# Patient Record
Sex: Female | Born: 1960 | Race: White | Hispanic: No | Marital: Married | Smoking: Never smoker
Health system: Southern US, Community
[De-identification: ages and names within clinical notes are randomized; demographics above are authoritative.]

## PROBLEM LIST (undated history)

## (undated) DIAGNOSIS — Z789 Other specified health status: Secondary | ICD-10-CM

## (undated) DIAGNOSIS — N841 Polyp of cervix uteri: Secondary | ICD-10-CM

## (undated) HISTORY — PX: NO PAST SURGERIES: SHX2092

## (undated) HISTORY — DX: Polyp of cervix uteri: N84.1

---

## 2011-08-07 ENCOUNTER — Ambulatory Visit: Payer: BC Managed Care – PPO | Attending: Internal Medicine | Admitting: Sleep Medicine

## 2011-08-07 DIAGNOSIS — Z6829 Body mass index (BMI) 29.0-29.9, adult: Secondary | ICD-10-CM | POA: Insufficient documentation

## 2011-08-07 DIAGNOSIS — G4733 Obstructive sleep apnea (adult) (pediatric): Secondary | ICD-10-CM | POA: Insufficient documentation

## 2011-08-07 DIAGNOSIS — G4710 Hypersomnia, unspecified: Secondary | ICD-10-CM | POA: Insufficient documentation

## 2011-08-07 DIAGNOSIS — G4730 Sleep apnea, unspecified: Secondary | ICD-10-CM

## 2011-08-09 NOTE — Procedures (Signed)
[name], [name]                 ACCOUNT NO.:  [id number]  MEDICAL RECORD NO.:  [id number]          PATIENT TYPE:  OUT  LOCATION:  SLEEP LAB                     FACILITY:  [facility]  PHYSICIAN:  Maurina Fawaz A. Gerilyn Pilgrim, M.D. DATE OF BIRTH:  1961/08/25  DATE OF STUDY:                           NOCTURNAL POLYSOMNOGRAM  REFERRING PHYSICIAN:  ZACK HALL  INDICATIONS:  A 50 year old who presents with fatigue, snoring and hypersomnia.  The study is being done to evaluate for obstructive sleep apnea syndrome.  MEDICATIONS:  Vitamin C.  EPWORTH SLEEPINESS SCALE:  9.  BMI:  29.  ARCHITECTURAL SUMMARY:  The total recording time is 430 minutes.  Sleep efficiency 86%, sleep latency 28.5 MIN.  REM latency 98.5 MIN.  Stage N1 6%, N2 51%, N3 23% and REM sleep 21%.  RESPIRATORY SUMMARY:  Baseline oxygen saturation is 98, lowest saturation is 88 during REM sleep.  Diagnostic AHI is 5 and RDI 13.  LIMB MOVEMENT SUMMARY:  PLM index is 1.  ELECTROCARDIOGRAM SUMMARY:  Average heart rate is 71 with no significant dysrhythmias observed.  IMPRESSION: 1. Mild obstructive sleep apnea syndrome not requiring positive     pressure 2. 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  Thank you for this referral.     Desiree Fleming A. Gerilyn Pilgrim, M.D.    KAD/MEDQ  D:  08/08/2011 16:10:96  T:  08/09/2011 02:57:52  Job:  [id number]

## 2016-10-30 ENCOUNTER — Other Ambulatory Visit (HOSPITAL_COMMUNITY): Payer: Self-pay | Admitting: Internal Medicine

## 2016-10-30 DIAGNOSIS — Z1231 Encounter for screening mammogram for malignant neoplasm of breast: Secondary | ICD-10-CM

## 2016-11-09 ENCOUNTER — Encounter (HOSPITAL_COMMUNITY): Payer: Self-pay | Admitting: Radiology

## 2016-11-09 ENCOUNTER — Ambulatory Visit (HOSPITAL_COMMUNITY)
Admission: RE | Admit: 2016-11-09 | Discharge: 2016-11-09 | Disposition: A | Discharge status: Home / Self Care | Payer: BC Managed Care – PPO | Source: Ambulatory Visit | Attending: Internal Medicine | Admitting: Internal Medicine

## 2016-11-09 DIAGNOSIS — Z1231 Encounter for screening mammogram for malignant neoplasm of breast: Secondary | ICD-10-CM | POA: Diagnosis not present

## 2019-06-07 ENCOUNTER — Other Ambulatory Visit (HOSPITAL_COMMUNITY): Payer: Self-pay | Admitting: Adult Health Nurse Practitioner

## 2019-06-07 DIAGNOSIS — Z1231 Encounter for screening mammogram for malignant neoplasm of breast: Secondary | ICD-10-CM

## 2019-06-07 DIAGNOSIS — Z780 Asymptomatic menopausal state: Secondary | ICD-10-CM

## 2019-06-07 DIAGNOSIS — E2839 Other primary ovarian failure: Secondary | ICD-10-CM

## 2019-06-14 ENCOUNTER — Encounter: Payer: Self-pay | Admitting: *Deleted

## 2019-07-17 ENCOUNTER — Ambulatory Visit: Payer: BC Managed Care – PPO

## 2019-07-26 ENCOUNTER — Other Ambulatory Visit: Payer: Self-pay

## 2019-07-26 ENCOUNTER — Ambulatory Visit (INDEPENDENT_AMBULATORY_CARE_PROVIDER_SITE_OTHER): Payer: Self-pay | Admitting: *Deleted

## 2019-07-26 DIAGNOSIS — Z1211 Encounter for screening for malignant neoplasm of colon: Secondary | ICD-10-CM

## 2019-07-26 MED ORDER — PEG 3350-KCL-NA BICARB-NACL 420 G PO SOLR: 4000.0000 mL | Freq: Once | ORAL | 0 refills | Status: AC

## 2019-07-26 NOTE — Progress Notes (Signed)
Gastroenterology Pre-Procedure Review  Request Date: 07/26/2019 Requesting Physician: Dr. Wende Neighbors, no previous TCS  PATIENT REVIEW QUESTIONS: The patient responded to the following health history questions as indicated:    1. Diabetes Melitis: no 2. Joint replacements in the past 12 months: no 3. Major health problems in the past 3 months: no 4. Has an artificial valve or MVP: no 5. Has a defibrillator: no 6. Has been advised in past to take antibiotics in advance of a procedure like teeth cleaning: no 7. Family history of colon cancer: yes, grandmother age 36  8. Alcohol Use: no 9. Illicit drug Use: no 10. History of sleep apnea: yes, mild but no CPAP required  11. History of coronary artery or other vascular stents placed within the last 12 months: no 12. History of any prior anesthesia complications: no 13. There is no height or weight on file to calculate BMI.ht: 5'7 wt: 185 lbs    MEDICATIONS & ALLERGIES:    Patient reports the following regarding taking any blood thinners:   Plavix? no Aspirin? no Coumadin? no Brilinta? no Xarelto? no Eliquis? no Pradaxa? no Savaysa? no Effient? no  Patient confirms/reports the following medications:  Current Outpatient Medications  Medication Sig Dispense Refill  . Ascorbic Acid (VITAMIN C PO) Take by mouth once a week.    . Cholecalciferol (VITAMIN D-3) 25 MCG (1000 UT) CAPS Take by mouth once a week.     No current facility-administered medications for this visit.     Patient confirms/reports the following allergies:  Allergies  Allergen Reactions  . Sulfamethoxazole Hives    No orders of the defined types were placed in this encounter.   AUTHORIZATION INFORMATION Primary Insurance: [place] [place],  [place] #: [id number],  Group #: [id number] Pre-Cert / Josem Kaufmann required: No, not required  SCHEDULE INFORMATION: Procedure has been scheduled as follows:  Date: 10/10/2019, Time: 9:30 Location: [facility] with Dr. Gala Romney  This  Gastroenterology Pre-Precedure Review Form is being routed to the following provider(s): Neil Crouch, PA-C

## 2019-07-26 NOTE — Patient Instructions (Signed)
Jodi [name]   February 09, 1961 MRN: [id number]    Procedure Date: 10/10/2019 Time to register: 8:30 Place to register: [facility] Procedure Time: 9:30 Scheduled provider: Dr. Gala Romney  PREPARATION FOR COLONOSCOPY WITH TRI-LYTE SPLIT PREP  Please notify us immediately if you are diabetic, take iron supplements, or if you are on Coumadin or any other blood thinners.    You will need to purchase 1 fleet enema and 1 box of Bisacodyl 41m tablets.   2 DAYS BEFORE PROCEDURE:  DATE: 10/08/2019  DAY: Sunday Begin clear liquid diet AFTER your lunch meal. NO SOLID FOODS after this point.  1 DAY BEFORE PROCEDURE:  DATE: 10/09/2019  DAY: Monday Continue clear liquids the entire day - NO SOLID FOOD.    At 2:00 pm:  Take 2 Bisacodyl tablets.   At 4:00pm:  Start drinking your solution. Make sure you mix well per instructions on the bottle. Try to drink 1 (one) 8 ounce glass every 10-15 minutes until you have consumed HALF the jug. You should complete by 6:00pm.You must keep the left over solution refrigerated until completed next day.  Continue clear liquids. You must drink plenty of clear liquids to prevent dehyration and kidney failure.     DAY OF PROCEDURE:   DATE: 10/10/2019  DAY: Tuesday If you take medications for your heart, blood pressure or breathing, you may take these medications.    Five hours before your procedure time @ 4:30 am:  Finish remaining amout of bowel prep, drinking 1 (one) 8 ounce glass every 10-15 minutes until complete. You have two hours to consume remaining prep.   Three hours before your procedure time @ 6:30 am:  Nothing by mouth.   At least one hour before going to the hospital:  Give yourself one Fleet enema. You may take your morning medications with sip of water unless we have instructed otherwise.      Please see below for Dietary Information.  CLEAR LIQUIDS INCLUDE:  Water Jello (NOT red in color)   Ice Popsicles (NOT red in color)   Tea (sugar  ok, no milk/cream) Powdered fruit flavored drinks  Coffee (sugar ok, no milk/cream) Gatorade/ Lemonade/ Kool-Aid  (NOT red in color)   Juice: apple, white grape, white cranberry Soft drinks  Clear bullion, consomme, broth (fat free beef/chicken/vegetable)  Carbonated beverages (any kind)  Strained chicken noodle soup Hard Candy   Remember: Clear liquids are liquids that will allow you to see your fingers on the other side of a clear glass. Be sure liquids are NOT red in color, and not cloudy, but CLEAR.  DO NOT EAT OR DRINK ANY OF THE FOLLOWING:  Dairy products of any kind   Cranberry juice Tomato juice / V8 juice   Grapefruit juice Orange juice     Red grape juice  Do not eat any solid foods, including such foods as: cereal, oatmeal, yogurt, fruits, vegetables, creamed soups, eggs, bread, crackers, pureed foods in a blender, etc.   HELPFUL HINTS FOR DRINKING PREP SOLUTION:   Make sure prep is extremely cold. Mix and refrigerate the the morning of the prep. You may also put in the freezer.   You may try mixing some Crystal Light or Country Time Lemonade if you prefer. Mix in small amounts; add more if necessary.  Try drinking through a straw  Rinse mouth with water or a mouthwash between glasses, to remove after-taste.  Try sipping on a cold beverage /ice/ popsicles between glasses of prep.  Place  a piece of sugar-free hard candy in mouth between glasses.  If you become nauseated, try consuming smaller amounts, or stretch out the time between glasses. Stop for 30-60 minutes, then slowly start back drinking.        OTHER INSTRUCTIONS  You will need a responsible adult at least 59 years of age to accompany you and drive you home. This person must remain in the waiting room during your procedure. The hospital will cancel your procedure if you do not have a responsible adult with you.   1. Wear loose fitting clothing that is easily removed. 2. Leave jewelry and other valuables  at home.  3. Remove all body piercing jewelry and leave at home. 4. Total time from sign-in until discharge is approximately 2-3 hours. 5. You should go home directly after your procedure and rest. You can resume normal activities the day after your procedure. 6. The day of your procedure you should not:  Drive  Make legal decisions  Operate machinery  Drink alcohol  Return to work   You may call the office (Dept: [phone]) before 5:00pm, or page the doctor on call [phone]) after 5:00pm, for further instructions, if necessary.   Insurance Information YOU WILL NEED TO CHECK WITH YOUR INSURANCE COMPANY FOR THE BENEFITS OF COVERAGE YOU HAVE FOR THIS PROCEDURE.  UNFORTUNATELY, NOT ALL INSURANCE COMPANIES HAVE BENEFITS TO COVER ALL OR PART OF THESE TYPES OF PROCEDURES.  IT IS YOUR RESPONSIBILITY TO CHECK YOUR BENEFITS, HOWEVER, WE WILL BE GLAD TO ASSIST YOU WITH ANY CODES YOUR INSURANCE COMPANY MAY NEED.    PLEASE NOTE THAT MOST INSURANCE COMPANIES WILL NOT COVER A SCREENING COLONOSCOPY FOR PEOPLE UNDER THE AGE OF 50  IF YOU HAVE BCBS INSURANCE, YOU MAY HAVE BENEFITS FOR A SCREENING COLONOSCOPY BUT IF POLYPS ARE FOUND THE DIAGNOSIS WILL CHANGE AND THEN YOU MAY HAVE A DEDUCTIBLE THAT WILL NEED TO BE MET. SO PLEASE MAKE SURE YOU CHECK YOUR BENEFITS FOR A SCREENING COLONOSCOPY AS WELL AS A DIAGNOSTIC COLONOSCOPY.

## 2019-07-26 NOTE — Addendum Note (Signed)
Addended by: Metro Kung on: 07/26/2019 12:40 PM   Modules accepted: Orders, SmartSet

## 2019-07-26 NOTE — Progress Notes (Signed)
Ok to schedule.

## 2019-09-11 ENCOUNTER — Telehealth: Payer: Self-pay | Admitting: *Deleted

## 2019-09-11 NOTE — Telephone Encounter (Addendum)
Pt called in and requested to cancel her procedure.  She did not want to reschedule right now.  She said that she may call us back sometime in the summer.  Pt aware that she may need to get her PCP to send a new referral if it is over 6 months.  Pt voiced understanding.  Endo notified of cancellation.

## 2019-10-06 ENCOUNTER — Other Ambulatory Visit (HOSPITAL_COMMUNITY): Payer: BC Managed Care – PPO

## 2019-10-10 ENCOUNTER — Ambulatory Visit (HOSPITAL_COMMUNITY): Admit: 2019-10-10 | Payer: BC Managed Care – PPO | Admitting: Internal Medicine

## 2019-10-10 ENCOUNTER — Encounter (HOSPITAL_COMMUNITY): Payer: Self-pay

## 2019-10-10 SURGERY — COLONOSCOPY
Anesthesia: Moderate Sedation

## 2020-06-12 ENCOUNTER — Other Ambulatory Visit (HOSPITAL_COMMUNITY): Payer: Self-pay | Admitting: Internal Medicine

## 2020-06-12 DIAGNOSIS — Z1231 Encounter for screening mammogram for malignant neoplasm of breast: Secondary | ICD-10-CM

## 2020-06-20 ENCOUNTER — Encounter: Payer: Self-pay | Admitting: *Deleted

## 2020-08-14 ENCOUNTER — Ambulatory Visit: Payer: Self-pay

## 2020-08-14 ENCOUNTER — Encounter: Payer: Self-pay | Admitting: Internal Medicine

## 2020-08-14 ENCOUNTER — Other Ambulatory Visit: Payer: Self-pay

## 2021-08-18 ENCOUNTER — Other Ambulatory Visit (HOSPITAL_COMMUNITY): Payer: Self-pay | Admitting: Family Medicine

## 2021-08-18 DIAGNOSIS — M79645 Pain in left finger(s): Secondary | ICD-10-CM

## 2021-08-18 DIAGNOSIS — Z13820 Encounter for screening for osteoporosis: Secondary | ICD-10-CM

## 2021-08-18 DIAGNOSIS — Z1231 Encounter for screening mammogram for malignant neoplasm of breast: Secondary | ICD-10-CM

## 2021-08-21 ENCOUNTER — Ambulatory Visit (HOSPITAL_COMMUNITY)
Admission: RE | Admit: 2021-08-21 | Discharge: 2021-08-21 | Disposition: A | Discharge status: Home / Self Care | Payer: BC Managed Care – PPO | Source: Ambulatory Visit | Attending: Family Medicine | Admitting: Family Medicine

## 2021-08-21 ENCOUNTER — Encounter: Payer: Self-pay | Admitting: *Deleted

## 2021-08-21 ENCOUNTER — Other Ambulatory Visit: Payer: Self-pay

## 2021-08-21 DIAGNOSIS — M79645 Pain in left finger(s): Secondary | ICD-10-CM | POA: Diagnosis not present

## 2021-09-16 ENCOUNTER — Ambulatory Visit: Payer: BC Managed Care – PPO

## 2021-09-23 ENCOUNTER — Ambulatory Visit: Payer: Self-pay

## 2021-09-25 ENCOUNTER — Ambulatory Visit (INDEPENDENT_AMBULATORY_CARE_PROVIDER_SITE_OTHER): Payer: Self-pay | Admitting: *Deleted

## 2021-09-25 ENCOUNTER — Other Ambulatory Visit: Payer: Self-pay

## 2021-09-25 VITALS — Ht 67.0 in | Wt 198.0 lb

## 2021-09-25 DIAGNOSIS — Z1211 Encounter for screening for malignant neoplasm of colon: Secondary | ICD-10-CM

## 2021-09-25 NOTE — Progress Notes (Addendum)
Gastroenterology Pre-Procedure Review  Request Date: 09/25/2021 Requesting Physician: Susy Manor, FNP-C, no previous TCS, family hx of colon cancer (father, grandmother)  PATIENT REVIEW QUESTIONS: The patient responded to the following health history questions as indicated:    1. Diabetes Melitis: no 2. Joint replacements in the past 12 months: no 3. Major health problems in the past 3 months: no 4. Has an artificial valve or MVP: no 5. Has a defibrillator: no 6. Has been advised in past to take antibiotics in advance of a procedure like teeth cleaning: no 7. Family history of colon cancer: yes, father: age 51, grandmother: age 28's  8. Alcohol Use: no 9. Illicit drug Use: no 10. History of sleep apnea: yes, mild sleep apnea, not on CPAP  11. History of coronary artery or other vascular stents placed within the last 12 months: no 12. History of any prior anesthesia complications: no 13. Body mass index is 31.01 kg/m.    MEDICATIONS & ALLERGIES:    Patient reports the following regarding taking any blood thinners:   Plavix? no Aspirin? no Coumadin? no Brilinta? no Xarelto? no Eliquis? no Pradaxa? no Savaysa? no Effient? no  Patient confirms/reports the following medications:  Current Outpatient Medications  Medication Sig Dispense Refill   Ascorbic Acid (VITAMIN C PO) Take by mouth once a week.     Cholecalciferol (VITAMIN D-3) 25 MCG (1000 UT) CAPS Take by mouth once a week.     No current facility-administered medications for this visit.    Patient confirms/reports the following allergies:  Allergies  Allergen Reactions   Sulfamethoxazole Hives    No orders of the defined types were placed in this encounter.   AUTHORIZATION INFORMATION Primary Insurance: [facility],  [place] #: [id number],  Group #: [id number] Pre-Cert / Berkley Harvey required: No, not required  SCHEDULE INFORMATION: Procedure has been scheduled as follows:  Date: 11/07/2021, Time:   8:15 Location: [facility] with Dr. Marletta Lor  This Gastroenterology Pre-Precedure Review Form is being routed to the following provider(s): Lewie Loron, NP

## 2021-09-29 NOTE — Progress Notes (Signed)
Appropriate. ASA 2.  

## 2021-10-06 ENCOUNTER — Encounter: Payer: Self-pay | Admitting: *Deleted

## 2021-10-06 MED ORDER — CLENPIQ 10-3.5-12 MG-GM -GM/160ML PO SOLN: 1.0000 | Freq: Once | ORAL | 0 refills | Status: AC

## 2021-10-06 NOTE — Progress Notes (Signed)
Spoke to pt.  Scheduled procedure for 11/07/2021 with arrival at 6:45.  Pt made aware that I am mailing out prep instructions.  Pt confirmed mailing address.

## 2021-10-06 NOTE — Addendum Note (Signed)
Addended by: Noreene Larsson on: 10/06/2021 09:13 AM   Modules accepted: Orders

## 2021-11-07 ENCOUNTER — Ambulatory Visit (HOSPITAL_COMMUNITY): Payer: BC Managed Care – PPO | Admitting: Anesthesiology

## 2021-11-07 ENCOUNTER — Encounter (HOSPITAL_COMMUNITY): Payer: Self-pay

## 2021-11-07 ENCOUNTER — Encounter (HOSPITAL_COMMUNITY)
Admission: RE | Disposition: A | Discharge status: Home / Self Care | Payer: Self-pay | Source: Home / Self Care | Attending: Internal Medicine

## 2021-11-07 ENCOUNTER — Other Ambulatory Visit: Payer: Self-pay

## 2021-11-07 ENCOUNTER — Ambulatory Visit (HOSPITAL_COMMUNITY)
Admission: RE | Admit: 2021-11-07 | Discharge: 2021-11-07 | Disposition: A | Discharge status: Home / Self Care | Payer: BC Managed Care – PPO | Attending: Internal Medicine | Admitting: Internal Medicine

## 2021-11-07 DIAGNOSIS — Z800 Family history of malignant neoplasm of digestive organs: Secondary | ICD-10-CM | POA: Diagnosis not present

## 2021-11-07 DIAGNOSIS — K5730 Diverticulosis of large intestine without perforation or abscess without bleeding: Secondary | ICD-10-CM | POA: Diagnosis not present

## 2021-11-07 DIAGNOSIS — Z1211 Encounter for screening for malignant neoplasm of colon: Secondary | ICD-10-CM

## 2021-11-07 HISTORY — PX: COLONOSCOPY WITH PROPOFOL: SHX5780

## 2021-11-07 HISTORY — DX: Other specified health status: Z78.9

## 2021-11-07 SURGERY — COLONOSCOPY WITH PROPOFOL
Anesthesia: General

## 2021-11-07 MED ORDER — LIDOCAINE HCL (CARDIAC) PF 100 MG/5ML IV SOSY
PREFILLED_SYRINGE | INTRAVENOUS | Status: DC | PRN
  Administered 2021-11-07: 50 mg via INTRAVENOUS

## 2021-11-07 MED ORDER — PROPOFOL 10 MG/ML IV BOLUS
INTRAVENOUS | Status: DC | PRN
  Administered 2021-11-07: 100 mg via INTRAVENOUS
  Administered 2021-11-07 (×2): 50 mg via INTRAVENOUS

## 2021-11-07 MED ORDER — LACTATED RINGERS IV SOLN: INTRAVENOUS | Status: DC

## 2021-11-07 NOTE — Discharge Instructions (Addendum)
°  Colonoscopy Discharge Instructions  Read the instructions outlined below and refer to this sheet in the next few weeks. These discharge instructions provide you with general information on caring for yourself after you leave the hospital. Your doctor may also give you specific instructions. While your treatment has been planned according to the most current medical practices available, unavoidable complications occasionally occur.   ACTIVITY You may resume your regular activity, but move at a slower pace for the next 24 hours.  Take frequent rest periods for the next 24 hours.  Walking will help get rid of the air and reduce the bloated feeling in your belly (abdomen).  No driving for 24 hours (because of the medicine (anesthesia) used during the test).   Do not sign any important legal documents or operate any machinery for 24 hours (because of the anesthesia used during the test).  NUTRITION Drink plenty of fluids.  You may resume your normal diet as instructed by your doctor.  Begin with a light meal and progress to your normal diet. Heavy or fried foods are harder to digest and may make you feel sick to your stomach (nauseated).  Avoid alcoholic beverages for 24 hours or as instructed.  MEDICATIONS You may resume your normal medications unless your doctor tells you otherwise.  WHAT YOU CAN EXPECT TODAY Some feelings of bloating in the abdomen.  Passage of more gas than usual.  Spotting of blood in your stool or on the toilet paper.  IF YOU HAD POLYPS REMOVED DURING THE COLONOSCOPY: No aspirin products for 7 days or as instructed.  No alcohol for 7 days or as instructed.  Eat a soft diet for the next 24 hours.  FINDING OUT THE RESULTS OF YOUR TEST Not all test results are available during your visit. If your test results are not back during the visit, make an appointment with your caregiver to find out the results. Do not assume everything is normal if you have not heard from your  caregiver or the medical facility. It is important for you to follow up on all of your test results.  SEEK IMMEDIATE MEDICAL ATTENTION IF: You have more than a spotting of blood in your stool.  Your belly is swollen (abdominal distention).  You are nauseated or vomiting.  You have a temperature over 101.  You have abdominal pain or discomfort that is severe or gets worse throughout the day.   Your colonoscopy was relatively unremarkable.  I did not find any polyps or evidence of colon cancer.  I recommend repeating colonoscopy in 10 years for colon cancer screening purposes.  You do have diverticulosis. I would recommend increasing fiber in your diet or adding OTC Benefiber/Metamucil. Be sure to drink at least 4 to 6 glasses of water daily. Follow-up with GI as needed.   I hope you have a great rest of your week!  Charles K. Carver, D.O. Gastroenterology and Hepatology [facility]  

## 2021-11-07 NOTE — Transfer of Care (Signed)
Immediate Anesthesia Transfer of Care Note  Patient: Jodi [name]  Procedure(s) Performed: COLONOSCOPY WITH PROPOFOL  Patient Location: Endoscopy Unit  Anesthesia Type:General  Level of Consciousness: awake and alert   Airway & Oxygen Therapy: Patient Spontanous Breathing  Post-op Assessment: Report given to RN and Post -op Vital signs reviewed and stable  Post vital signs: Reviewed and stable  Last Vitals:  Vitals Value Taken Time  BP    Temp    Pulse    Resp    SpO2      Last Pain:  Vitals:   11/07/21 0801  TempSrc:   PainSc: 0-No pain      Patients Stated Pain Goal: 7 (11/07/21 0700)  Complications: No notable events documented.

## 2021-11-07 NOTE — Anesthesia Preprocedure Evaluation (Signed)
Anesthesia Evaluation  Patient identified by MRN, date of birth, ID band Patient awake    Reviewed: Allergy & Precautions, NPO status , Patient's Chart, lab work & pertinent test results  Airway Mallampati: I  TM Distance: >3 FB Neck ROM: Full    Dental no notable dental hx.    Pulmonary neg pulmonary ROS,    Pulmonary exam normal        Cardiovascular negative cardio ROS Normal cardiovascular exam     Neuro/Psych negative neurological ROS     GI/Hepatic negative GI ROS, Neg liver ROS,   Endo/Other  negative endocrine ROS  Renal/GU negative Renal ROS     Musculoskeletal negative musculoskeletal ROS (+)   Abdominal   Peds  Hematology negative hematology ROS (+)   Anesthesia Other Findings   Reproductive/Obstetrics                             Anesthesia Physical Anesthesia Plan  ASA: 2  Anesthesia Plan: General   Post-op Pain Management:    Induction: Intravenous  PONV Risk Score and Plan: 2 and TIVA  Airway Management Planned: Natural Airway and Nasal Cannula  Additional Equipment:   Intra-op Plan:   Post-operative Plan:   Informed Consent: I have reviewed the patients History and Physical, chart, labs and discussed the procedure including the risks, benefits and alternatives for the proposed anesthesia with the patient or authorized representative who has indicated his/her understanding and acceptance.     Dental advisory given  Plan Discussed with: CRNA  Anesthesia Plan Comments:         Anesthesia Quick Evaluation

## 2021-11-07 NOTE — Op Note (Signed)
[facility] Patient Name: Jodi [name] Procedure Date: 11/07/2021 8:07 AM MRN: [id number] Date of Birth: 18-Nov-1960 Attending MD: Elon Alas. Abbey Chatters DO CSN: [id number] Age: 61 Admit Type: Outpatient Procedure:                Colonoscopy Indications:              Screening in patient at increased risk: Family                            history of father and paternal grandmother with                            colorectal cancer Providers:                Elon Alas. Abbey Chatters, DO, Hughie Closs, RN, Janeece Riggers, RN, Randa Spike, Technician Referring MD:              Medicines:                See the Anesthesia note for documentation of the                            administered medications Complications:            No immediate complications. Estimated Blood Loss:     Estimated blood loss: none. Procedure:                Pre-Anesthesia Assessment:                           - The anesthesia plan was to use monitored                            anesthesia care (MAC).                           After obtaining informed consent, the colonoscope                            was passed under direct vision. Throughout the                            procedure, the patient's blood pressure, pulse, and                            oxygen saturations were monitored continuously. The                            [id number]-HQ190L ([id number]) scope was introduced through                            the anus and advanced to the the cecum, identified                            by appendiceal orifice and ileocecal valve.  The                            colonoscopy was performed without difficulty. The                            patient tolerated the procedure well. The quality                            of the bowel preparation was evaluated using the                            BBPS [facility] Bowel Preparation Scale) with scores                            of: Right Colon = 3, Transverse Colon  = 3 and Left                            Colon = 3 (entire mucosa seen well with no residual                            staining, small fragments of stool or opaque                            liquid). The total BBPS score equals 9. Scope In: 8:11:59 AM Scope Out: 8:22:25 AM Scope Withdrawal Time: 0 hours 7 minutes 28 seconds  Total Procedure Duration: 0 hours 10 minutes 26 seconds  Findings:      The perianal and digital rectal examinations were normal.      A few small-mouthed diverticula were found in the sigmoid colon.      The exam was otherwise without abnormality. Impression:               - Diverticulosis in the sigmoid colon.                           - The examination was otherwise normal.                           - No specimens collected. Moderate Sedation:      Per Anesthesia Care Recommendation:           - Patient has a contact number available for                            emergencies. The signs and symptoms of potential                            delayed complications were discussed with the                            patient. Return to normal activities tomorrow.                            Written discharge instructions were provided to the  patient.                           - Resume previous diet.                           - Continue present medications.                           - Repeat colonoscopy in 5-10 years for screening                            purposes.                           - Return to GI clinic PRN. Procedure Code(s):        --- Professional ---                           M6004, Colorectal cancer screening; colonoscopy on                            individual at high risk Diagnosis Code(s):        --- Professional ---                           Z80.0, Family history of malignant neoplasm of                            digestive organs                           K57.30, Diverticulosis of large intestine without                             perforation or abscess without bleeding CPT copyright 2019 American Medical Association. All rights reserved. The codes documented in this report are preliminary and upon coder review may  be revised to meet current compliance requirements. Elon Alas. Abbey Chatters, DO [place] Abbey Chatters, DO 11/07/2021 8:25:25 AM This report has been signed electronically. Number of Addenda: 0

## 2021-11-07 NOTE — H&P (Signed)
Primary Care Physician:  Celene Squibb, MD Primary Gastroenterologist:  Dr. Abbey Chatters  Pre-Procedure History & Physical: HPI:  Jodi [name] is a 61 y.o. female is here for a colonoscopy to be performed for high risk colon cancer screening purposes. no previous TCS, family hx of colon cancer (father, grandmother)  Past Medical History:  Diagnosis Date   Medical history non-contributory     Past Surgical History:  Procedure Laterality Date   NO PAST SURGERIES      Prior to Admission medications   Medication Sig Start Date End Date Taking? Authorizing Provider  ibuprofen (ADVIL) 200 MG tablet Take 400 mg by mouth every 6 (six) hours as needed for moderate pain or headache.   Yes [provider]    Allergies as of 10/06/2021 - Review Complete 09/25/2021  Allergen Reaction Noted   Sulfamethoxazole Hives 03/23/2017    Family History  Problem Relation Age of Onset   Colon cancer Father    Colon cancer Other     Social History   Socioeconomic History   Marital status: Married    Spouse name: Not on file   Number of children: Not on file   Years of education: Not on file   Highest education level: Not on file  Occupational History   Not on file  Tobacco Use   Smoking status: Never   Smokeless tobacco: Never  Vaping Use   Vaping Use: Never used  Substance and Sexual Activity   Alcohol use: Not on file    Comment: rarely   Drug use: Never   Sexual activity: Not on file  Other Topics Concern   Not on file  Social History Narrative   Not on file   Social Determinants of Health   Financial Resource Strain: Not on file  Food Insecurity: Not on file  Transportation Needs: Not on file  Physical Activity: Not on file  Stress: Not on file  Social Connections: Not on file  Intimate Partner Violence: Not on file    Review of Systems: See HPI, otherwise negative ROS  Physical Exam: Vital signs in last 24 hours: Temp:  [98.2 F (36.8 C)] 98.2 F (36.8 C) (01/20  0700) Pulse Rate:  [82] 82 (01/20 0700) Resp:  [15] 15 (01/20 0700) BP: (174)/(80) 174/80 (01/20 0700) SpO2:  [100 %] 100 % (01/20 0700) Weight:  [89.8 kg] 89.8 kg (01/20 0700)   General:   Alert,  Well-developed, well-nourished, pleasant and cooperative in NAD Head:  Normocephalic and atraumatic. Eyes:  Sclera clear, no icterus.   Conjunctiva pink. Ears:  Normal auditory acuity. Nose:  No deformity, discharge,  or lesions. Mouth:  No deformity or lesions, dentition normal. Neck:  Supple; no masses or thyromegaly. Lungs:  Clear throughout to auscultation.   No wheezes, crackles, or rhonchi. No acute distress. Heart:  Regular rate and rhythm; no murmurs, clicks, rubs,  or gallops. Abdomen:  Soft, nontender and nondistended. No masses, hepatosplenomegaly or hernias noted. Normal bowel sounds, without guarding, and without rebound.   Msk:  Symmetrical without gross deformities. Normal posture. Extremities:  Without clubbing or edema. Neurologic:  Alert and  oriented x4;  grossly normal neurologically. Skin:  Intact without significant lesions or rashes. Cervical Nodes:  No significant cervical adenopathy. Psych:  Alert and cooperative. Normal mood and affect.  Impression/Plan: Jodi [name] is here for a colonoscopy to be performed for high risk colon cancer screening purposes. no previous TCS, family hx of colon cancer (father, grandmother)  The risks  of the procedure including infection, bleed, or perforation as well as benefits, limitations, alternatives and imponderables have been reviewed with the patient. Questions have been answered. All parties agreeable.

## 2021-11-07 NOTE — Anesthesia Postprocedure Evaluation (Signed)
Anesthesia Post Note  Patient: Jodi [name]  Procedure(s) Performed: COLONOSCOPY WITH PROPOFOL  Patient location during evaluation: Endoscopy Anesthesia Type: General Level of consciousness: awake and alert Pain management: pain level controlled Vital Signs Assessment: post-procedure vital signs reviewed and stable Respiratory status: spontaneous breathing, nonlabored ventilation, respiratory function stable and patient connected to nasal cannula oxygen Cardiovascular status: blood pressure returned to baseline and stable Postop Assessment: no apparent nausea or vomiting Anesthetic complications: no   No notable events documented.   Last Vitals:  Vitals:   11/07/21 0700 11/07/21 0826  BP: (!) 174/80 117/84  Pulse: 82   Resp: 15 (!) 21  Temp: 36.8 C 36.5 C  SpO2: 100% 97%    Last Pain:  Vitals:   11/07/21 0826  TempSrc: Axillary  PainSc: 0-No pain                 Glynis Smiles

## 2021-11-10 ENCOUNTER — Encounter (HOSPITAL_COMMUNITY): Payer: Self-pay | Admitting: Internal Medicine

## 2022-08-20 ENCOUNTER — Other Ambulatory Visit (HOSPITAL_COMMUNITY): Payer: Self-pay | Admitting: Nurse Practitioner

## 2022-08-20 DIAGNOSIS — Z1231 Encounter for screening mammogram for malignant neoplasm of breast: Secondary | ICD-10-CM

## 2022-08-20 DIAGNOSIS — Z13820 Encounter for screening for osteoporosis: Secondary | ICD-10-CM

## 2022-11-15 IMAGING — CR DG FINGER THUMB 2+V*L*
3 series · 3 of 3 positions shown · non-contrast
Comparison: None.

CLINICAL DATA: Pain left thumb

EXAM:
LEFT THUMB 2+V

[x oblique left]
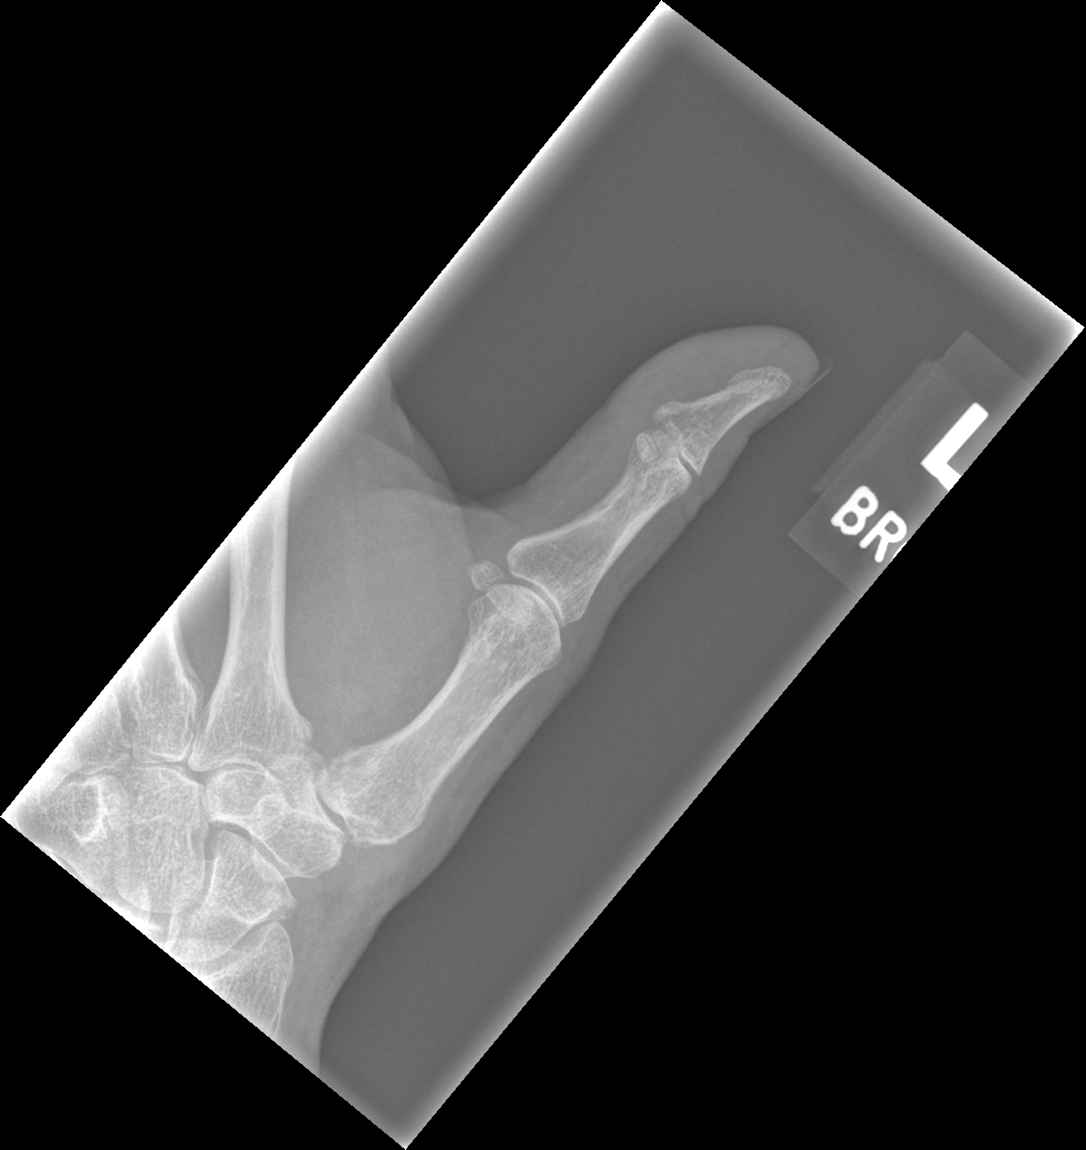

[x  lateral left]
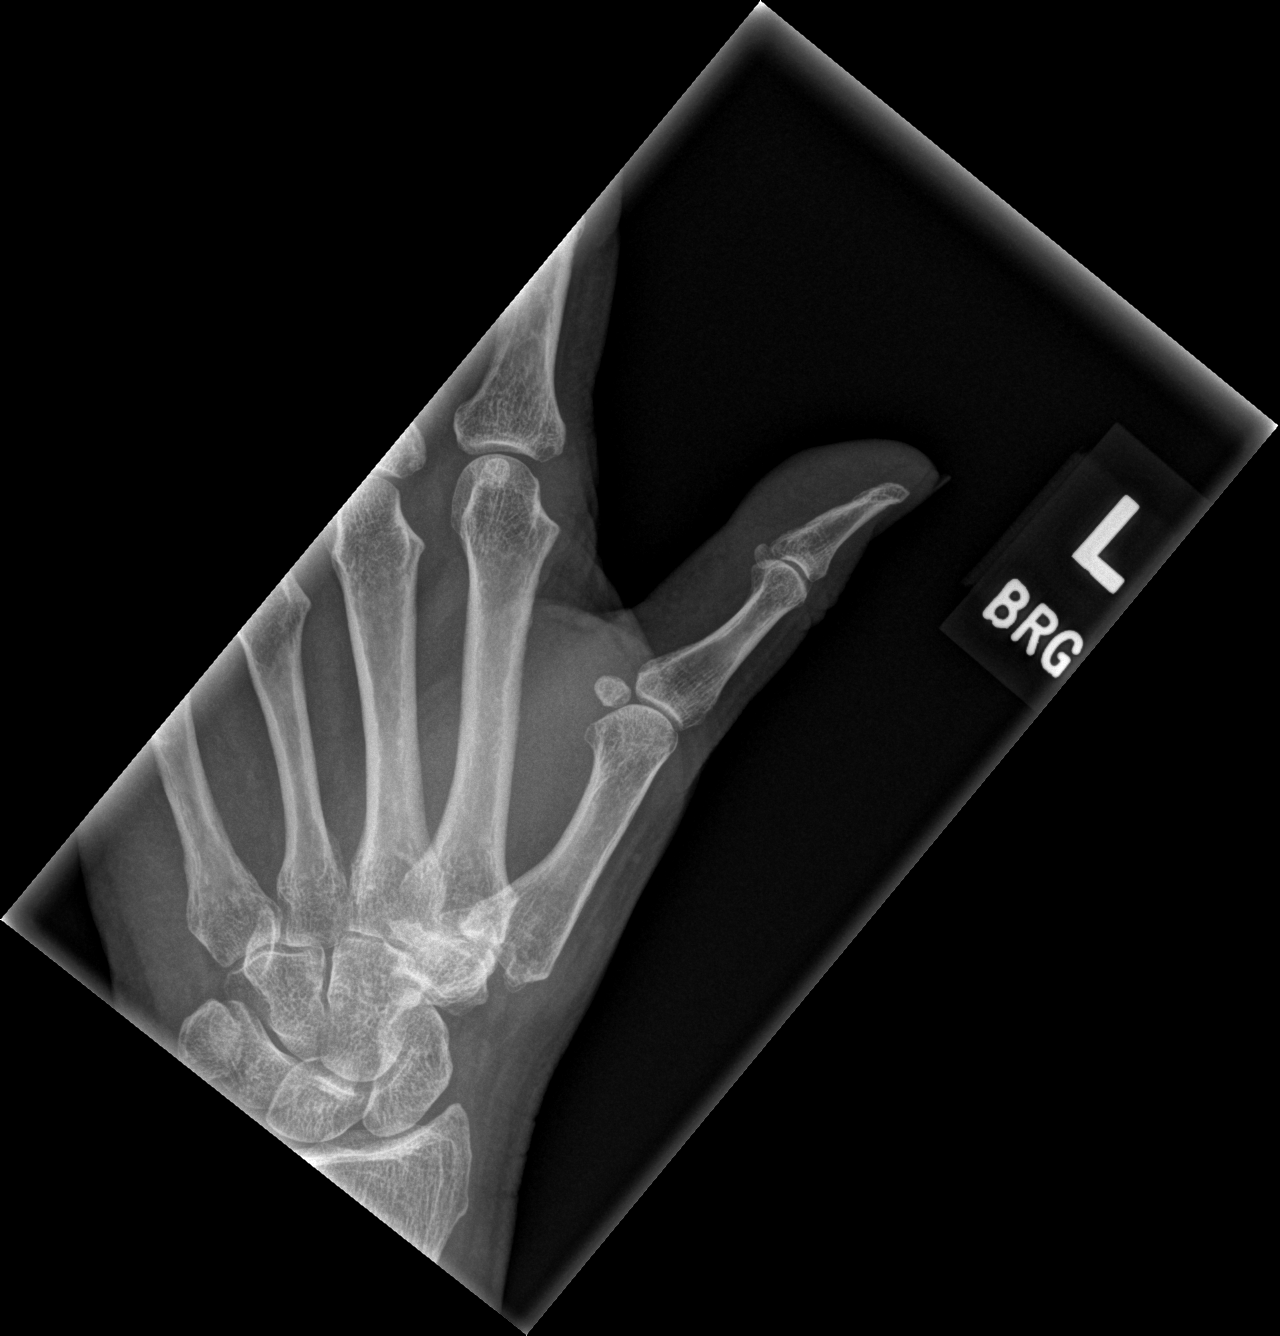

[x  pa left]
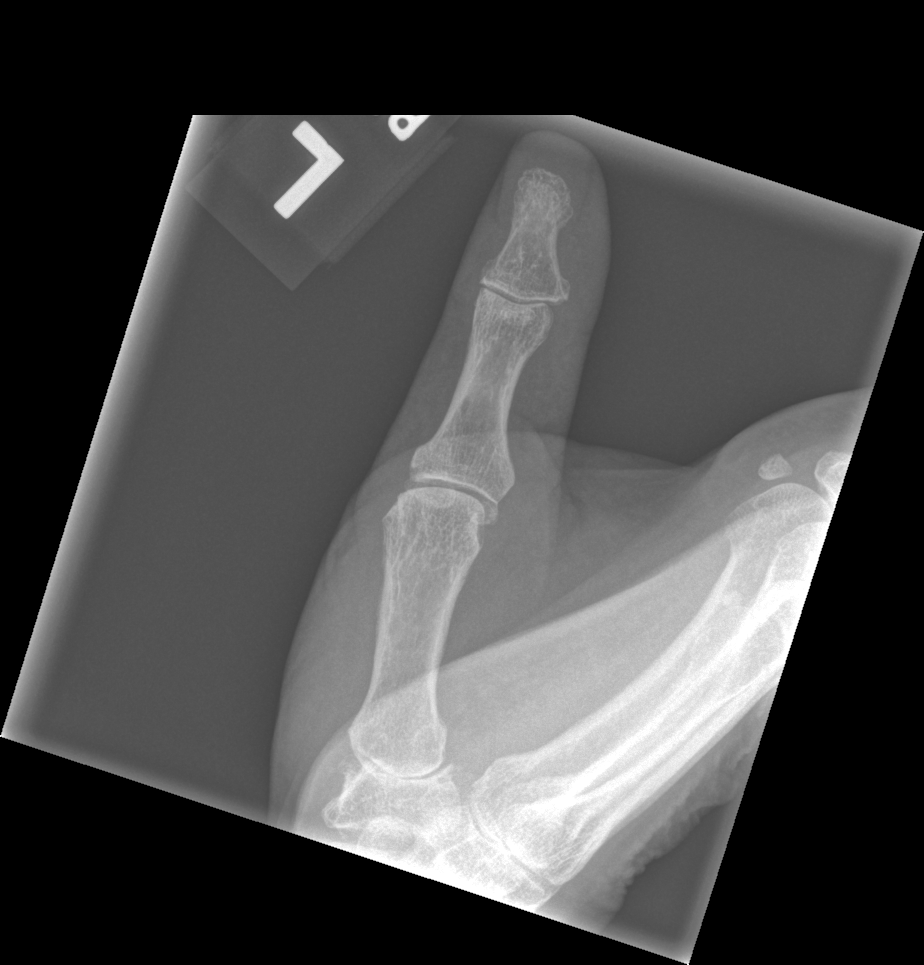

[3 of 3 positions shown; findings below may reference images not displayed]

FINDINGS: No fracture or dislocation is seen. Degenerative changes are noted
in first carpometacarpal joint. Possible minimal bony spurs seen in
the interphalangeal joint of the thumb.
IMPRESSION: No recent fracture or dislocation is seen. Degenerative changes are
noted in first carpometacarpal joint with bony spurs and subcortical
cysts. Possible minimal bony spurs seen in the interphalangeal
joint.

## 2023-06-04 ENCOUNTER — Ambulatory Visit (HOSPITAL_COMMUNITY)
Admission: RE | Admit: 2023-06-04 | Discharge: 2023-06-04 | Disposition: A | Discharge status: Home / Self Care | Payer: BC Managed Care – PPO | Source: Ambulatory Visit | Attending: Nurse Practitioner | Admitting: Nurse Practitioner

## 2023-06-04 ENCOUNTER — Encounter (HOSPITAL_COMMUNITY): Payer: Self-pay

## 2023-06-04 DIAGNOSIS — Z1231 Encounter for screening mammogram for malignant neoplasm of breast: Secondary | ICD-10-CM | POA: Diagnosis present

## 2023-06-04 DIAGNOSIS — Z13820 Encounter for screening for osteoporosis: Secondary | ICD-10-CM | POA: Insufficient documentation

## 2023-10-06 ENCOUNTER — Other Ambulatory Visit (HOSPITAL_COMMUNITY): Payer: Self-pay | Admitting: Internal Medicine

## 2023-10-06 DIAGNOSIS — R7401 Elevation of levels of liver transaminase levels: Secondary | ICD-10-CM

## 2023-10-18 ENCOUNTER — Ambulatory Visit (HOSPITAL_COMMUNITY)
Admission: RE | Admit: 2023-10-18 | Discharge: 2023-10-18 | Disposition: A | Discharge status: Home / Self Care | Payer: BC Managed Care – PPO | Source: Ambulatory Visit | Attending: Internal Medicine | Admitting: Internal Medicine

## 2023-10-18 DIAGNOSIS — R7401 Elevation of levels of liver transaminase levels: Secondary | ICD-10-CM | POA: Insufficient documentation

## 2023-11-02 ENCOUNTER — Other Ambulatory Visit (HOSPITAL_COMMUNITY)
Admission: RE | Admit: 2023-11-02 | Discharge: 2023-11-02 | Disposition: A | Discharge status: Home / Self Care | Payer: 59 | Source: Ambulatory Visit | Attending: Obstetrics & Gynecology | Admitting: Obstetrics & Gynecology

## 2023-11-02 ENCOUNTER — Ambulatory Visit: Payer: 59 | Admitting: Obstetrics & Gynecology

## 2023-11-02 ENCOUNTER — Encounter: Payer: Self-pay | Admitting: Obstetrics & Gynecology

## 2023-11-02 VITALS — BP 192/89 | HR 81 | Ht 67.0 in | Wt 149.0 lb

## 2023-11-02 DIAGNOSIS — R030 Elevated blood-pressure reading, without diagnosis of hypertension: Secondary | ICD-10-CM

## 2023-11-02 DIAGNOSIS — N841 Polyp of cervix uteri: Secondary | ICD-10-CM | POA: Insufficient documentation

## 2023-11-02 NOTE — Progress Notes (Signed)
 Follow up appointment for: Cervical polyp  Chief Complaint  Patient presents with   cervical polyp    Blood pressure (!) 190/85, pulse 79, height 5' 7 (1.702 m), weight 149 lb (67.6 kg), last menstrual period 04/07/2016.  Pap testing was normal  On exam saw cervical polyp referred here for follow up  Exam: 1 polyp and 1 complex cervical lesion Polyp removed and sent separately Multiple biopsies taken of the complex cervical mass, likely benign  MEDS ordered this encounter: No orders of the defined types were placed in this encounter.   Orders for this encounter: No orders of the defined types were placed in this encounter.   Impression + Management Plan   ICD-10-CM   1. Cervical polyp  N84.1 Surgical pathology( [place]/ [facility])    2. White coat syndrome with high blood pressure but without hypertension  R03.0    pt is going to keep a daily log of her BP readings and relay to her PCP if >135/85 consistently      Follow Up: No follow-ups on file.     All questions were answered.  Past Medical History:  Diagnosis Date   Medical history non-contributory     Past Surgical History:  Procedure Laterality Date   COLONOSCOPY WITH PROPOFOL  N/A 11/07/2021   Procedure: COLONOSCOPY WITH PROPOFOL ;  Surgeon: Cindie Carlin POUR, DO;  Location: [facility];  Service: Endoscopy;  Laterality: N/A;  8:15 / ASA II   NO PAST SURGERIES      OB History     Gravida  2   Para  2   Term  2   Preterm      AB      Living  2      SAB      IAB      Ectopic      Multiple      Live Births              Allergies  Allergen Reactions   Sulfamethoxazole Hives    Social History   Socioeconomic History   Marital status: Married    Spouse name: Not on file   Number of children: Not on file   Years of education: Not on file   Highest education level: Not on file  Occupational History   Not on file  Tobacco Use   Smoking status: Never   Smokeless  tobacco: Never  Vaping Use   Vaping status: Never Used  Substance and Sexual Activity   Alcohol use: Not on file    Comment: rarely   Drug use: Never   Sexual activity: Yes    Birth control/protection: None  Other Topics Concern   Not on file  Social History Narrative   Not on file   Social Drivers of Health   Financial Resource Strain: Low Risk  (11/02/2023)   Overall Financial Resource Strain (CARDIA)    Difficulty of Paying Living Expenses: Not hard at all  Food Insecurity: No Food Insecurity (11/02/2023)   Hunger Vital Sign    Worried About Running Out of Food in the Last Year: Never true    Ran Out of Food in the Last Year: Never true  Transportation Needs: No Transportation Needs (11/02/2023)   PRAPARE - Administrator, Civil Service (Medical): No    Lack of Transportation (Non-Medical): No  Physical Activity: Insufficiently Active (11/02/2023)   Exercise Vital Sign    Days of Exercise per Week: 2 days  Minutes of Exercise per Session: 30 min  Stress: No Stress Concern Present (11/02/2023)   [employer] of Occupational Health - Occupational Stress Questionnaire    Feeling of Stress : Only a little  Social Connections: Socially Integrated (11/02/2023)   Social Connection and Isolation Panel [NHANES]    Frequency of Communication with Friends and Family: More than three times a week    Frequency of Social Gatherings with Friends and Family: Once a week    Attends Religious Services: More than 4 times per year    Active Member of [employer] or Organizations: Yes    Attends Engineer, Structural: More than 4 times per year    Marital Status: Married    Family History  Problem Relation Age of Onset   Colon cancer Father    Colon cancer Other

## 2023-11-02 NOTE — Addendum Note (Signed)
 Addended by: Annamarie Dawley on: 11/02/2023 04:10 PM   Modules accepted: Orders

## 2023-11-04 LAB — SURGICAL PATHOLOGY

## 2023-11-09 ENCOUNTER — Encounter: Payer: Self-pay | Admitting: Obstetrics & Gynecology

## 2023-11-11 ENCOUNTER — Encounter: Payer: Self-pay | Admitting: Obstetrics & Gynecology

## 2023-11-11 ENCOUNTER — Telehealth: Payer: 59 | Admitting: Obstetrics & Gynecology

## 2023-11-11 DIAGNOSIS — N840 Polyp of corpus uteri: Secondary | ICD-10-CM | POA: Diagnosis not present

## 2023-11-11 DIAGNOSIS — N870 Mild cervical dysplasia: Secondary | ICD-10-CM

## 2023-11-11 NOTE — Progress Notes (Signed)
My Chart coneect video + audio visit I am in my office Patient is at work, private room Total time 10 minutes  Follow up appointment for results: Cervical biopsy  Chief Complaint  Patient presents with   Discuss results    Last menstrual period 04/07/2016.  Endometrial polyp, benign   LSIL on cervical biopsy, inadequate eval because you cannot see above the cervical LSIL lesion  MEDS ordered this encounter: No orders of the defined types were placed in this encounter.   Orders for this encounter: No orders of the defined types were placed in this encounter.   Impression + Management Plan   ICD-10-CM   1. Endometrial polyp  N84.0     2. Dysplasia of cervix, low grade (CIN 1): inadequate evalaution(can't see past lesion)  N87.0     Recommend sonogram to evaluate endometrium  Depending on that likley hysteroscopy D&C + laser conization of the cervix  Follow Up: Return for schedule GYN sonogram and see Dr Despina Hidden same day.     All questions were answered.  Past Medical History:  Diagnosis Date   Cervical polyp    Medical history non-contributory     Past Surgical History:  Procedure Laterality Date   COLONOSCOPY WITH PROPOFOL N/A 11/07/2021   Procedure: COLONOSCOPY WITH PROPOFOL;  Surgeon: Lanelle Bal, DO;  Location: [facility];  Service: Endoscopy;  Laterality: N/A;  8:15 / ASA II   NO PAST SURGERIES      OB History     Gravida  2   Para  2   Term  2   Preterm      AB      Living  2      SAB      IAB      Ectopic      Multiple      Live Births              Allergies  Allergen Reactions   Sulfamethoxazole Hives    Social History   Socioeconomic History   Marital status: Married    Spouse name: Not on file   Number of children: Not on file   Years of education: Not on file   Highest education level: Not on file  Occupational History   Not on file  Tobacco Use   Smoking status: Never   Smokeless tobacco: Never   Vaping Use   Vaping status: Never Used  Substance and Sexual Activity   Alcohol use: Not on file    Comment: rarely   Drug use: Never   Sexual activity: Yes    Birth control/protection: None  Other Topics Concern   Not on file  Social History Narrative   Not on file   Social Drivers of Health   Financial Resource Strain: Low Risk  (11/02/2023)   Overall Financial Resource Strain (CARDIA)    Difficulty of Paying Living Expenses: Not hard at all  Food Insecurity: No Food Insecurity (11/02/2023)   Hunger Vital Sign    Worried About Running Out of Food in the Last Year: Never true    Ran Out of Food in the Last Year: Never true  Transportation Needs: No Transportation Needs (11/02/2023)   PRAPARE - Administrator, Civil Service (Medical): No    Lack of Transportation (Non-Medical): No  Physical Activity: Insufficiently Active (11/02/2023)   Exercise Vital Sign    Days of Exercise per Week: 2 days    Minutes of Exercise per  Session: 30 min  Stress: No Stress Concern Present (11/02/2023)   [employer] of Occupational Health - Occupational Stress Questionnaire    Feeling of Stress : Only a little  Social Connections: Socially Integrated (11/02/2023)   Social Connection and Isolation Panel [NHANES]    Frequency of Communication with Friends and Family: More than three times a week    Frequency of Social Gatherings with Friends and Family: Once a week    Attends Religious Services: More than 4 times per year    Active Member of [employer] or Organizations: Yes    Attends Engineer, structural: More than 4 times per year    Marital Status: Married    Family History  Problem Relation Age of Onset   Colon cancer Father    Colon cancer Other

## 2023-11-28 ENCOUNTER — Other Ambulatory Visit: Payer: Self-pay | Admitting: Obstetrics & Gynecology

## 2023-11-28 DIAGNOSIS — N840 Polyp of corpus uteri: Secondary | ICD-10-CM

## 2023-11-28 DIAGNOSIS — Z780 Asymptomatic menopausal state: Secondary | ICD-10-CM

## 2023-11-30 ENCOUNTER — Ambulatory Visit (INDEPENDENT_AMBULATORY_CARE_PROVIDER_SITE_OTHER): Payer: 59 | Admitting: Radiology

## 2023-11-30 ENCOUNTER — Ambulatory Visit: Payer: 59 | Admitting: Obstetrics & Gynecology

## 2023-11-30 ENCOUNTER — Encounter: Payer: Self-pay | Admitting: Obstetrics & Gynecology

## 2023-11-30 VITALS — BP 211/92 | HR 94

## 2023-11-30 DIAGNOSIS — N870 Mild cervical dysplasia: Secondary | ICD-10-CM

## 2023-11-30 DIAGNOSIS — Z780 Asymptomatic menopausal state: Secondary | ICD-10-CM

## 2023-11-30 DIAGNOSIS — N840 Polyp of corpus uteri: Secondary | ICD-10-CM

## 2023-11-30 NOTE — Progress Notes (Addendum)
 Follow up appointment for results: sonogram  Chief Complaint  Patient presents with   Follow-up    [place] today    Blood pressure (!) 211/92, pulse 94, last menstrual period 04/07/2016.  Pathology  #1 Endometrial polyp(not cervical polyp)  #2 Cervical dysplasia, low grade, but inadequate colposcopy in post menopausal woman  Will need laser of the cervix for management of the cervical dysplasia since it is inadequate colposcopy ES 2.2 mm but will do hysteroscopy D&C endometrium removal of polyp if  still present + endometrial sampling     MEDS ordered this encounter: No orders of the defined types were placed in this encounter.   Orders for this encounter: No orders of the defined types were placed in this encounter.   Impression + Management Plan   ICD-10-CM   1. Endometrial polyp  N84.0     2. Dysplasia of cervix, low grade (CIN 1): inadequate evalaution(can't see past lesion)  N87.0      Hysteroscopy D&C  with removal of endometrial polyp + Laser ablation of the cervix is scheduled for 02/02/24  Follow Up: Return in about 2 months (around 02/14/2024) for Post Op, with Dr Despina Hidden.     All questions were answered.  Past Medical History:  Diagnosis Date   Cervical polyp    Medical history non-contributory     Past Surgical History:  Procedure Laterality Date   COLONOSCOPY WITH PROPOFOL N/A 11/07/2021   Procedure: COLONOSCOPY WITH PROPOFOL;  Surgeon: Lanelle Bal, DO;  Location: [facility];  Service: Endoscopy;  Laterality: N/A;  8:15 / ASA II   NO PAST SURGERIES      OB History     Gravida  2   Para  2   Term  2   Preterm      AB      Living  2      SAB      IAB      Ectopic      Multiple      Live Births              Allergies  Allergen Reactions   Sulfamethoxazole Hives    Social History   Socioeconomic History   Marital status: Married    Spouse name: Not on file   Number of children: Not on file   Years of  education: Not on file   Highest education level: Not on file  Occupational History   Not on file  Tobacco Use   Smoking status: Never   Smokeless tobacco: Never  Vaping Use   Vaping status: Never Used  Substance and Sexual Activity   Alcohol use: Not on file    Comment: rarely   Drug use: Never   Sexual activity: Yes    Birth control/protection: None  Other Topics Concern   Not on file  Social History Narrative   Not on file   Social Drivers of Health   Financial Resource Strain: Low Risk  (11/02/2023)   Overall Financial Resource Strain (CARDIA)    Difficulty of Paying Living Expenses: Not hard at all  Food Insecurity: No Food Insecurity (11/02/2023)   Hunger Vital Sign    Worried About Running Out of Food in the Last Year: Never true    Ran Out of Food in the Last Year: Never true  Transportation Needs: No Transportation Needs (11/02/2023)   PRAPARE - Administrator, Civil Service (Medical): No    Lack of Transportation (Non-Medical):  No  Physical Activity: Insufficiently Active (11/02/2023)   Exercise Vital Sign    Days of Exercise per Week: 2 days    Minutes of Exercise per Session: 30 min  Stress: No Stress Concern Present (11/02/2023)   [employer] of Occupational Health - Occupational Stress Questionnaire    Feeling of Stress : Only a little  Social Connections: Socially Integrated (11/02/2023)   Social Connection and Isolation Panel [NHANES]    Frequency of Communication with Friends and Family: More than three times a week    Frequency of Social Gatherings with Friends and Family: Once a week    Attends Religious Services: More than 4 times per year    Active Member of [employer] or Organizations: Yes    Attends Engineer, structural: More than 4 times per year    Marital Status: Married    Family History  Problem Relation Age of Onset   Colon cancer Father    Colon cancer Other

## 2023-11-30 NOTE — Progress Notes (Signed)
TA and TV imaging performed - Chaperone: Amanda - vinyl probe cover used  Anteverted atrophic uterus with single intramural fibroid mid right ut wall = 27 x 22 mm Myometrium appears homogeneous and symmetrical There is a tiny amount of clear fluid within endometrial cavity.  No evidence of polyps are seen.  Avascular cavity and canal.  Endometrial thickness = 2.2 mm.  Walls appear thin and smoothly marginated.  Normal ov's bilaterally - neg adnexal regions - neg CDS - no free fluid present

## 2023-12-06 ENCOUNTER — Encounter: Payer: Self-pay | Admitting: Obstetrics & Gynecology

## 2024-01-27 NOTE — Patient Instructions (Addendum)
 Jodi [name]  01/27/2024     @PREFPERIOPPHARMACY @   Your procedure is scheduled on  02/02/2024.   Report to [facility] at  0900  A.M.   Call this number if you have problems the morning of surgery:  [phone]  If you experience any cold or flu symptoms such as cough, fever, chills, shortness of breath, etc. between now and your scheduled surgery, please notify us  at the above number.   Remember:  Do not eat after midnight.   You may drink clear liquids until  0700 am on 02/02/2024.    Clear liquids allowed are:                    Water, Juice (No red color; non-citric and without pulp; diabetics please choose diet or no sugar options), Carbonated beverages (diabetics please choose diet or no sugar options), Clear Tea (No creamer, milk, or cream, including half & half and powdered creamer), Black Coffee Only (No creamer, milk or cream, including half & half and powdered creamer), and Clear Sports drink (No red color; diabetics please choose diet or no sugar options)        At 0700 am on 02/02/2024 drink your carb drink. You can have nothing else to drink after this.   Take these medicines the morning of surgery with A SIP OF WATER                                                          None.    Do not wear jewelry, make-up or nail polish, including gel polish,  artificial nails, or any other type of covering on natural nails (fingers and  toes).  Do not wear lotions, powders, or perfumes, or deodorant.  Do not shave 48 hours prior to surgery.  Men may shave face and neck.  Do not bring valuables to the hospital.  [facility] is not responsible for any belongings or valuables.  Contacts, dentures or bridgework may not be worn into surgery.  Leave your suitcase in the car.  After surgery it may be brought to your room.  For patients admitted to the hospital, discharge time will be determined by your treatment team.  Patients discharged the day of surgery will not be  allowed to drive home and must have someone with them for 24 hours.    Special instructions:   DO NOT smoke tobacco or vape for 24 hours before your procedure.  Please read over the following fact sheets that you were given. Coughing and Deep Breathing, Surgical Site Infection Prevention, Anesthesia Post-op Instructions, and Care and Recovery After Surgery        Dilation and Curettage or Vacuum Curettage, Care After The following information offers guidance on how to care for yourself after your procedure. Your doctor may also give you more specific instructions. If you have problems or questions, contact your doctor. What can I expect after the procedure? After the procedure, it is common to have: Mild pain or cramps. Some bleeding or spotting from the vagina. These may last for up to 2 weeks. Follow these instructions at home: Medicines Take over-the-counter and prescription medicines only as told by your doctor. If told, take steps to prevent problems with pooping (constipation). You may need to: Drink  enough fluid to keep your pee (urine) pale yellow. Take medicines. You will be told what medicines to take. Eat foods that are high in fiber. These include beans, whole grains, and fresh fruits and vegetables. Limit foods that are high in fat and sugar. These include fried or sweet foods. Ask your doctor if you should avoid driving or using machines while you are taking your medicine. Activity  If you were given a medicine to help you relax (sedative) during your procedure, it can affect you for many hours. Do not drive or use machinery until your doctor says that it is safe. Rest as told by your doctor. Get up to take short walks every 1-2 hours. Ask for help if you feel weak or unsteady. Do not lift anything that is heavier than 10 lb (4.5 kg), or the limit that you are told. Return to your normal activities when your doctor says that it is safe. Lifestyle For at least 2  weeks, or as long as told by your doctor: Do not douche. Do not use tampons. Do not have sex. General instructions Do not take baths, swim, or use a hot tub. Ask your doctor if you may take showers. Do not smoke or use any products that contain nicotine or tobacco. These can delay healing. If you need help quitting, ask your doctor. Wear compression stockings as told by your doctor. It is up to you to get the results of your procedure. Ask how to get your results when they are ready. Keep all follow-up visits. Contact a doctor if: You have very bad cramps that get worse or do not get better with medicine. You have very bad pain in your belly (abdomen). You cannot drink fluids without vomiting. You have pain in the area just above your thighs. You have fluid from your vagina that smells bad. You have a rash. Get help right away if: You are bleeding a lot from your vagina. This means soaking more than one sanitary pad in 1 hour, and this happens for 2 hours in a row. You have a fever that is above 100.29F (38C). Your belly feels very tender or hard. You have chest pain. You have trouble breathing. You feel dizzy or light-headed. You faint. You have pain in your neck or shoulder area. These symptoms may be an emergency. Get help right away. Call your local emergency services (911 in the [place] Do not wait to see if the symptoms will go away. Do not drive yourself to the hospital. Summary After your procedure, it is common to have pain or cramping. It is also common to have bleeding or spotting from your vagina. Rest as told. Get up to take short walks every 1-2 hours. Do not lift anything that is heavier than 10 lb (4.5 kg), or the limit that you are told. Get help right away if you have problems from the procedure. Ask your doctor what problems to watch for. This information is not intended to replace advice given to you by your health care provider. Make sure you discuss any  questions you have with your health care provider. Document Revised: 09/23/2020 Document Reviewed: 09/25/2020 Elsevier Patient Education  2024 [employer] Cervical Laser Surgery, Care After After cervical laser surgery, it is common to have: Pain or discomfort. Mild cramping. Bleeding, spotting, or brownish discharge from your vagina. Follow these instructions at home: Activity  Rest as told by your health care provider. Return to your normal activities as told by you  provider. Ask your provider what activities are safe for you. Do not have sex until your provider says it is okay. General instructions Take over-the-counter and prescription medicines only as told by your provider. Ask your provider if the medicine prescribed to you requires you to avoid driving or using machinery. Wear menstrual pads to absorb any bleeding, spotting, and discharge. Do not put anything into your vagina, including tampons or douche, until your provider says it is okay. It is up to you to get the results of your procedure. Ask your provider, or the department that is doing the procedure, when your results will be ready. Your provider may give you more instructions. Make sure you know what you can and cannot do. Contact a health care provider if: Your pain or cramping does not improve. Your periods are more painful than usual. You do not get your period as expected. Get help right away if: You have any symptoms of infection, such as: A fever. Chills. Discharge that smells bad. You have severe pain in your lower abdomen. You have heavy bleeding from your vagina. A sign of heavy bleeding is that you need to use more than one pad per hour. You have vaginal bleeding with clumps of blood (blood clots). This information is not intended to replace advice given to you by your health care provider. Make sure you discuss any questions you have with your health care provider. Document Revised: 06/16/2022  Document Reviewed: 06/16/2022 Elsevier Patient Education  2024 [employer]General Anesthesia, Adult, Care After The following information offers guidance on how to care for yourself after your procedure. Your health care provider may also give you more specific instructions. If you have problems or questions, contact your health care provider. What can I expect after the procedure? After the procedure, it is common for people to: Have pain or discomfort at the IV site. Have nausea or vomiting. Have a sore throat or hoarseness. Have trouble concentrating. Feel cold or chills. Feel weak, sleepy, or tired (fatigue). Have soreness and body aches. These can affect parts of the body that were not involved in surgery. Follow these instructions at home: For the time period you were told by your health care provider:  Rest. Do not participate in activities where you could fall or become injured. Do not drive or use machinery. Do not drink alcohol. Do not take sleeping pills or medicines that cause drowsiness. Do not make important decisions or sign legal documents. Do not take care of children on your own. General instructions Drink enough fluid to keep your urine pale yellow. If you have sleep apnea, surgery and certain medicines can increase your risk for breathing problems. Follow instructions from your health care provider about wearing your sleep device: Anytime you are sleeping, including during daytime naps. While taking prescription pain medicines, sleeping medicines, or medicines that make you drowsy. Return to your normal activities as told by your health care provider. Ask your health care provider what activities are safe for you. Take over-the-counter and prescription medicines only as told by your health care provider. Do not use any products that contain nicotine or tobacco. These products include cigarettes, chewing tobacco, and vaping devices, such as e-cigarettes. These can  delay incision healing after surgery. If you need help quitting, ask your health care provider. Contact a health care provider if: You have nausea or vomiting that does not get better with medicine. You vomit every time you eat or drink. You have pain that does not  get better with medicine. You cannot urinate or have bloody urine. You develop a skin rash. You have a fever. Get help right away if: You have trouble breathing. You have chest pain. You vomit blood. These symptoms may be an emergency. Get help right away. Call 911. Do not wait to see if the symptoms will go away. Do not drive yourself to the hospital. Summary After the procedure, it is common to have a sore throat, hoarseness, nausea, vomiting, or to feel weak, sleepy, or fatigue. For the time period you were told by your health care provider, do not drive or use machinery. Get help right away if you have difficulty breathing, have chest pain, or vomit blood. These symptoms may be an emergency. This information is not intended to replace advice given to you by your health care provider. Make sure you discuss any questions you have with your health care provider. Document Revised: 01/02/2022 Document Reviewed: 01/02/2022 Elsevier Patient Education  2024 [employer]    Pre-operative CHG Instructions  You can play a key role in reducing the risk of infection after surgery. Your skin is not sterile and needs to be as free of germs as possible. You can reduce the number of germs on your skin by washing with CHG (chlorhexidine gluconate) soap before surgery. CHG is an antiseptic soap that kills germs and bonds with the skin to continue killing germs even after washing.  Please DO NOT use if you have an allergy to chlorhexidine/ CHG or antibacterial soaps. If your skin becomes reddened/irritated stop using the CHG and inform your nurse when you arrive at Short Stay.  Do not shave (including legs and underarms) for at least 48  hours prior to the first CHG shower. You may shave your face.  Please follow these instructions carefully:  1.  Shower with CHG Soap the night before surgery and the morning of surgery. 2.  If you choose to wash your hair, wash your hair first as usual with your normal shampoo/ conditioner. Wash your genitals (private parts) with your normal soap. 4.  After you shampoo/ condition hair and wash your private parts, rinse your hair and body thoroughly to remove the shampoo/ conditioner/ soap. 5.  Use CHG as you would any other liquid soap. You can apply CHG directly to the skin and wash gently using a clean washcloth. 6.  Apply the CHG Soap to your body ONLY FROM THE NECK DOWN.         Do not use on open wounds or open sores.  Avoid contact with your eyes, ears, mouth, and genitals (private parts).  Wash thoroughly, paying special attention to the area where your surgery will be performed. 7.  Thoroughly rinse your body with warm water from the neck down. 8.  DO NOT shower/ wash with your normal soap after using and rinsing off the CHG Soap. 9.  Pat yourself dry with a clean towel. DO NOT use lotions, powders, deodorants, or other non-prescription skin care products after taking the CHG bath. 10.  Wear clean pajamas. 11.  Place clean sheets on your bed the night of your first shower and do not sleep with pets.  Day of Surgery  Use CHG Soap as instructed above. Do not apply any lotions, deodorants, cologne, or perfumes on the morning of surgery. Ask your nurse before applying any prescription medications to the skin. Please wear clean clothes to the hospital/surgery center.

## 2024-01-31 ENCOUNTER — Encounter (HOSPITAL_COMMUNITY): Payer: Self-pay

## 2024-01-31 ENCOUNTER — Other Ambulatory Visit: Payer: Self-pay | Admitting: Obstetrics & Gynecology

## 2024-01-31 ENCOUNTER — Other Ambulatory Visit: Payer: Self-pay

## 2024-01-31 ENCOUNTER — Encounter (HOSPITAL_COMMUNITY)
Admission: RE | Admit: 2024-01-31 | Discharge: 2024-01-31 | Disposition: A | Discharge status: Home / Self Care | Payer: 59 | Source: Ambulatory Visit | Attending: Obstetrics & Gynecology | Admitting: Obstetrics & Gynecology

## 2024-01-31 DIAGNOSIS — Z01818 Encounter for other preprocedural examination: Secondary | ICD-10-CM | POA: Diagnosis present

## 2024-01-31 DIAGNOSIS — Z01812 Encounter for preprocedural laboratory examination: Secondary | ICD-10-CM | POA: Diagnosis not present

## 2024-01-31 LAB — CBC
HCT: 43.7 % (ref 36.0–46.0)
Hemoglobin: 13.8 g/dL (ref 12.0–15.0)
MCH: 29 pg (ref 26.0–34.0)
MCHC: 31.6 g/dL (ref 30.0–36.0)
MCV: 91.8 fL (ref 80.0–100.0)
Platelets: 276 10*3/uL (ref 150–400)
RBC: 4.76 MIL/uL (ref 3.87–5.11)
RDW: 13.2 % (ref 11.5–15.5)
WBC: 5.8 10*3/uL (ref 4.0–10.5)
nRBC: 0 % (ref 0.0–0.2)

## 2024-01-31 LAB — URINALYSIS, ROUTINE W REFLEX MICROSCOPIC
Bilirubin Urine: NEGATIVE
Glucose, UA: NEGATIVE mg/dL
Hgb urine dipstick: NEGATIVE
Ketones, ur: NEGATIVE mg/dL
Nitrite: NEGATIVE
Protein, ur: NEGATIVE mg/dL
Specific Gravity, Urine: 1.01 (ref 1.005–1.030)
pH: 5 (ref 5.0–8.0)

## 2024-01-31 LAB — COMPREHENSIVE METABOLIC PANEL WITH GFR
ALT: 33 U/L (ref 0–44)
AST: 26 U/L (ref 15–41)
Albumin: 3.9 g/dL (ref 3.5–5.0)
Alkaline Phosphatase: 84 U/L (ref 38–126)
Anion gap: 10 (ref 5–15)
BUN: 23 mg/dL (ref 8–23)
CO2: 24 mmol/L (ref 22–32)
Calcium: 8.9 mg/dL (ref 8.9–10.3)
Chloride: 104 mmol/L (ref 98–111)
Creatinine, Ser: 0.61 mg/dL (ref 0.44–1.00)
GFR, Estimated: >60 mL/min (ref >60)
Glucose, Bld: 104 mg/dL — ABNORMAL HIGH (ref 70–99)
Potassium: 3.5 mmol/L (ref 3.5–5.1)
Sodium: 138 mmol/L (ref 135–145)
Total Bilirubin: 0.7 mg/dL (ref 0.0–1.2)
Total Protein: 6.9 g/dL (ref 6.5–8.1)

## 2024-01-31 LAB — RAPID HIV SCREEN (HIV 1/2 AB+AG)
HIV 1/2 Antibodies: NONREACTIVE
HIV-1 P24 Antigen - HIV24: NONREACTIVE

## 2024-02-02 ENCOUNTER — Ambulatory Visit (HOSPITAL_BASED_OUTPATIENT_CLINIC_OR_DEPARTMENT_OTHER): Admitting: Certified Registered"

## 2024-02-02 ENCOUNTER — Ambulatory Visit (HOSPITAL_COMMUNITY)
Admission: RE | Admit: 2024-02-02 | Discharge: 2024-02-02 | Disposition: A | Discharge status: Home / Self Care | Payer: 59 | Attending: Obstetrics & Gynecology | Admitting: Obstetrics & Gynecology

## 2024-02-02 ENCOUNTER — Encounter (HOSPITAL_COMMUNITY)
Admission: RE | Disposition: A | Discharge status: Home / Self Care | Payer: Self-pay | Source: Home / Self Care | Attending: Obstetrics & Gynecology

## 2024-02-02 ENCOUNTER — Encounter (HOSPITAL_COMMUNITY): Payer: Self-pay | Admitting: Obstetrics & Gynecology

## 2024-02-02 ENCOUNTER — Ambulatory Visit (HOSPITAL_COMMUNITY): Admitting: Certified Registered"

## 2024-02-02 DIAGNOSIS — N870 Mild cervical dysplasia: Secondary | ICD-10-CM | POA: Diagnosis not present

## 2024-02-02 DIAGNOSIS — N840 Polyp of corpus uteri: Secondary | ICD-10-CM | POA: Diagnosis not present

## 2024-02-02 DIAGNOSIS — N950 Postmenopausal bleeding: Secondary | ICD-10-CM | POA: Insufficient documentation

## 2024-02-02 HISTORY — PX: HYSTEROSCOPY WITH D & C: SHX1775

## 2024-02-02 HISTORY — PX: CERVICAL ABLATION: SHX5771

## 2024-02-02 SURGERY — ABLATION, CERVIX
Anesthesia: General | Site: Uterus

## 2024-02-02 MED ORDER — ACETAMINOPHEN 10 MG/ML IV SOLN
INTRAVENOUS | Status: DC | PRN
  Administered 2024-02-02: 1000 mg via INTRAVENOUS

## 2024-02-02 MED ORDER — ONDANSETRON HCL 4 MG/2ML IJ SOLN
INTRAMUSCULAR | Status: DC | PRN
  Administered 2024-02-02: 4 mg via INTRAVENOUS

## 2024-02-02 MED ORDER — FENTANYL CITRATE (PF) 100 MCG/2ML IJ SOLN
INTRAMUSCULAR | Status: DC | PRN
  Administered 2024-02-02: 50 ug via INTRAVENOUS
  Administered 2024-02-02: 25 ug via INTRAVENOUS

## 2024-02-02 MED ORDER — 0.9 % SODIUM CHLORIDE (POUR BTL) OPTIME
TOPICAL | Status: DC | PRN
  Administered 2024-02-02: 1000 mL

## 2024-02-02 MED ORDER — KETOROLAC TROMETHAMINE 10 MG PO TABS: 10.0000 mg | ORAL_TABLET | Freq: Three times a day (TID) | ORAL | 0 refills | Status: AC | PRN

## 2024-02-02 MED ORDER — SODIUM CHLORIDE 0.9 % IR SOLN
Status: DC | PRN
  Administered 2024-02-02: 1000 mL

## 2024-02-02 MED ORDER — MONSELS FERRIC SUBSULFATE EX SOLN
CUTANEOUS | Status: DC | PRN
  Administered 2024-02-02: 1 via TOPICAL

## 2024-02-02 MED ORDER — SCOPOLAMINE 1 MG/3DAYS TD PT72
MEDICATED_PATCH | TRANSDERMAL | Status: DC | PRN
  Administered 2024-02-02: 1 mg via TRANSDERMAL

## 2024-02-02 MED ORDER — WATER FOR IRRIGATION, STERILE IR SOLN
Status: DC | PRN
Start: 2024-02-02 — End: 2024-02-03
  Administered 2024-02-02: 1000 mL

## 2024-02-02 MED ORDER — MIDAZOLAM HCL 5 MG/5ML IJ SOLN
INTRAMUSCULAR | Status: DC | PRN
  Administered 2024-02-02: 2 mg via INTRAVENOUS

## 2024-02-02 MED ORDER — LIDOCAINE HCL (PF) 2 % IJ SOLN
INTRAMUSCULAR | Status: AC
  Filled 2024-02-02: qty 5

## 2024-02-02 MED ORDER — CHLORHEXIDINE GLUCONATE 0.12 % MT SOLN: 15.0000 mL | Freq: Once | OROMUCOSAL | Status: DC

## 2024-02-02 MED ORDER — GLYCOPYRROLATE PF 0.2 MG/ML IJ SOSY
PREFILLED_SYRINGE | INTRAMUSCULAR | Status: DC | PRN
  Administered 2024-02-02: .2 mg via INTRAVENOUS

## 2024-02-02 MED ORDER — EPHEDRINE 5 MG/ML INJ
INTRAVENOUS | Status: AC
  Filled 2024-02-02: qty 5

## 2024-02-02 MED ORDER — KETOROLAC TROMETHAMINE 30 MG/ML IJ SOLN
INTRAMUSCULAR | Status: AC
  Filled 2024-02-02: qty 1

## 2024-02-02 MED ORDER — KETOROLAC TROMETHAMINE 30 MG/ML IJ SOLN
30.0000 mg | INTRAMUSCULAR | Status: AC
  Administered 2024-02-02: 30 mg via INTRAVENOUS

## 2024-02-02 MED ORDER — POVIDONE-IODINE 10 % EX SWAB
2.0000 | Freq: Once | CUTANEOUS | Status: AC
  Administered 2024-02-02: 2 via TOPICAL

## 2024-02-02 MED ORDER — DEXAMETHASONE SODIUM PHOSPHATE 10 MG/ML IJ SOLN
INTRAMUSCULAR | Status: AC
  Filled 2024-02-02: qty 1

## 2024-02-02 MED ORDER — PROPOFOL 10 MG/ML IV BOLUS
INTRAVENOUS | Status: DC | PRN
  Administered 2024-02-02: 160 mg via INTRAVENOUS
  Administered 2024-02-02: 30 mg via INTRAVENOUS
  Administered 2024-02-02: 10 ug/kg/min via INTRAVENOUS

## 2024-02-02 MED ORDER — EPHEDRINE SULFATE-NACL 50-0.9 MG/10ML-% IV SOSY
PREFILLED_SYRINGE | INTRAVENOUS | Status: DC | PRN
  Administered 2024-02-02 (×2): 5 mg via INTRAVENOUS
  Administered 2024-02-02: 10 mg via INTRAVENOUS

## 2024-02-02 MED ORDER — CEFAZOLIN SODIUM-DEXTROSE 2-4 GM/100ML-% IV SOLN
2.0000 g | INTRAVENOUS | Status: AC
  Administered 2024-02-02: 2 g via INTRAVENOUS

## 2024-02-02 MED ORDER — FENTANYL CITRATE (PF) 100 MCG/2ML IJ SOLN
INTRAMUSCULAR | Status: AC
  Filled 2024-02-02: qty 2

## 2024-02-02 MED ORDER — ONDANSETRON 8 MG PO TBDP: 8.0000 mg | ORAL_TABLET | Freq: Three times a day (TID) | ORAL | 0 refills | Status: AC | PRN

## 2024-02-02 MED ORDER — PROPOFOL 10 MG/ML IV BOLUS
INTRAVENOUS | Status: AC
  Filled 2024-02-02: qty 20

## 2024-02-02 MED ORDER — DEXAMETHASONE SODIUM PHOSPHATE 10 MG/ML IJ SOLN
INTRAMUSCULAR | Status: DC | PRN
  Administered 2024-02-02: 10 mg via INTRAVENOUS

## 2024-02-02 MED ORDER — CHLORHEXIDINE GLUCONATE 0.12 % MT SOLN
OROMUCOSAL | Status: AC
Start: 2024-02-02 — End: 2024-02-02
  Administered 2024-02-02: 15 mL
  Filled 2024-02-02: qty 15

## 2024-02-02 MED ORDER — HYDROCODONE-ACETAMINOPHEN 5-325 MG PO TABS: 1.0000 | ORAL_TABLET | Freq: Four times a day (QID) | ORAL | 0 refills | Status: AC | PRN

## 2024-02-02 MED ORDER — MONSELS FERRIC SUBSULFATE EX SOLN
CUTANEOUS | Status: AC
  Filled 2024-02-02: qty 8

## 2024-02-02 MED ORDER — ACETAMINOPHEN 10 MG/ML IV SOLN
INTRAVENOUS | Status: AC
  Filled 2024-02-02: qty 100

## 2024-02-02 MED ORDER — MIDAZOLAM HCL 2 MG/2ML IJ SOLN
INTRAMUSCULAR | Status: AC
  Filled 2024-02-02: qty 2

## 2024-02-02 MED ORDER — ONDANSETRON HCL 4 MG/2ML IJ SOLN: 4.0000 mg | Freq: Once | INTRAMUSCULAR | Status: DC | PRN

## 2024-02-02 MED ORDER — HYDROMORPHONE HCL 1 MG/ML IJ SOLN
0.2500 mg | INTRAMUSCULAR | Status: DC | PRN
  Administered 2024-02-02: 0.5 mg via INTRAVENOUS
  Filled 2024-02-02: qty 0.5

## 2024-02-02 MED ORDER — CEFAZOLIN SODIUM-DEXTROSE 2-4 GM/100ML-% IV SOLN
INTRAVENOUS | Status: AC
  Filled 2024-02-02: qty 100

## 2024-02-02 MED ORDER — ONDANSETRON HCL 4 MG/2ML IJ SOLN
INTRAMUSCULAR | Status: AC
  Filled 2024-02-02: qty 2

## 2024-02-02 MED ORDER — LIDOCAINE HCL (PF) 2 % IJ SOLN
INTRAMUSCULAR | Status: DC | PRN
  Administered 2024-02-02: 60 mg via INTRADERMAL

## 2024-02-02 MED ORDER — ACETIC ACID 5 % SOLN
Status: DC | PRN
  Administered 2024-02-02: 1 via TOPICAL

## 2024-02-02 MED ORDER — LACTATED RINGERS IV SOLN: INTRAVENOUS | Status: DC | PRN

## 2024-02-02 MED ORDER — OXYCODONE HCL 5 MG PO TABS: 5.0000 mg | ORAL_TABLET | Freq: Once | ORAL | Status: DC | PRN

## 2024-02-02 MED ORDER — SCOPOLAMINE 1 MG/3DAYS TD PT72
MEDICATED_PATCH | TRANSDERMAL | Status: AC
  Filled 2024-02-02: qty 1

## 2024-02-02 MED ORDER — OXYCODONE HCL 5 MG/5ML PO SOLN: 5.0000 mg | Freq: Once | ORAL | Status: DC | PRN

## 2024-02-02 MED ORDER — PHENYLEPHRINE 80 MCG/ML (10ML) SYRINGE FOR IV PUSH (FOR BLOOD PRESSURE SUPPORT)
PREFILLED_SYRINGE | INTRAVENOUS | Status: DC | PRN
  Administered 2024-02-02: 80 ug via INTRAVENOUS
  Administered 2024-02-02: 160 ug via INTRAVENOUS
  Administered 2024-02-02 (×3): 80 ug via INTRAVENOUS

## 2024-02-02 SURGICAL SUPPLY — 31 items
BAG HAMPER (MISCELLANEOUS) ×2 IMPLANT
CLOTH BEACON ORANGE TIMEOUT ST (SAFETY) ×2 IMPLANT
COVER LIGHT HANDLE STERIS (MISCELLANEOUS) ×4 IMPLANT
COVER MAYO STAND XLG (MISCELLANEOUS) ×2 IMPLANT
GAUZE 4X4 16PLY ~~LOC~~+RFID DBL (SPONGE) ×2 IMPLANT
GLOVE BIOGEL PI IND STRL 7.0 (GLOVE) ×4 IMPLANT
GLOVE BIOGEL PI IND STRL 8 (GLOVE) ×2 IMPLANT
GLOVE ECLIPSE 8.0 STRL XLNG CF (GLOVE) ×4 IMPLANT
GOWN STRL REUS W/TWL LRG LVL3 (GOWN DISPOSABLE) ×2 IMPLANT
GOWN STRL REUS W/TWL XL LVL3 (GOWN DISPOSABLE) ×2 IMPLANT
INST SET HYSTEROSCOPY (KITS) ×2 IMPLANT
IV NS 1000ML BAXH (IV SOLUTION) ×2 IMPLANT
KIT TURNOVER CYSTO (KITS) ×2 IMPLANT
KIT TURNOVER KIT A (KITS) ×2 IMPLANT
LASER FIBER 1000M SMARTSCOPE (Laser) ×2 IMPLANT
MANIFOLD NEPTUNE II (INSTRUMENTS) ×2 IMPLANT
MARKER SKIN DUAL TIP RULER LAB (MISCELLANEOUS) ×2 IMPLANT
NS IRRIG 1000ML POUR BTL (IV SOLUTION) ×2 IMPLANT
PACK SRG BSC III STRL LF ECLPS (CUSTOM PROCEDURE TRAY) ×2 IMPLANT
PAD ARMBOARD POSITIONER FOAM (MISCELLANEOUS) ×2 IMPLANT
PAD TELFA 3X4 1S STER (GAUZE/BANDAGES/DRESSINGS) ×2 IMPLANT
POSITIONER HEAD 8X9X4 ADT (SOFTGOODS) ×2 IMPLANT
SCOPETTES 8 STERILE (MISCELLANEOUS) ×2 IMPLANT
SET BASIN LINEN APH (SET/KITS/TRAYS/PACK) ×2 IMPLANT
SET CYSTO W/LG BORE CLAMP LF (SET/KITS/TRAYS/PACK) ×2 IMPLANT
SHEET LAVH (DRAPES) ×2 IMPLANT
SWAB PROCTOSCOPIC (MISCELLANEOUS) ×2 IMPLANT
TOWEL OR 17X26 4PK STRL BLUE (TOWEL DISPOSABLE) ×2 IMPLANT
TUBE CONNECTING 12X1/4 (SUCTIONS) ×2 IMPLANT
TUBING SMOKE EVAC CO2 (TUBING) ×2 IMPLANT
WATER STERILE IRR 1000ML POUR (IV SOLUTION) ×2 IMPLANT

## 2024-02-02 NOTE — Anesthesia Postprocedure Evaluation (Signed)
 Anesthesia Post Note  Patient: Jodi [name]  Procedure(s) Performed: CERVIX CONIZATION (Cervix) DILATATION AND CURETTAGE /HYSTEROSCOPY (Uterus)  Patient location during evaluation: PACU Anesthesia Type: General Level of consciousness: awake and alert Pain management: pain level controlled Vital Signs Assessment: post-procedure vital signs reviewed and stable Respiratory status: spontaneous breathing, nonlabored ventilation, respiratory function stable and patient connected to nasal cannula oxygen Cardiovascular status: blood pressure returned to baseline and stable Postop Assessment: no apparent nausea or vomiting Anesthetic complications: no  No notable events documented.   Last Vitals:  Vitals:   02/02/24 1315 02/02/24 1330  BP: (!) 147/69   Pulse: 69 69  Resp: 13 (!) 23  Temp:    SpO2: 100% 100%    Last Pain:  Vitals:   02/02/24 1330  PainSc: 0-No pain                 Beacher Limerick

## 2024-02-02 NOTE — Anesthesia Preprocedure Evaluation (Signed)
 Anesthesia Evaluation  Patient identified by MRN, date of birth, ID band Patient awake    Airway Mallampati: II  TM Distance: >3 FB Neck ROM: Full    Dental no notable dental hx. (+) Teeth Intact   Pulmonary neg pulmonary ROS   Pulmonary exam normal        Cardiovascular Exercise Tolerance: Good negative cardio ROS  Rhythm:Regular Rate:Normal     Neuro/Psych negative neurological ROS     GI/Hepatic negative GI ROS, Neg liver ROS,,,  Endo/Other  negative endocrine ROS    Renal/GU negative Renal ROS  negative genitourinary   Musculoskeletal negative musculoskeletal ROS (+)    Abdominal Normal abdominal exam  (+)   Peds negative pediatric ROS (+)  Hematology negative hematology ROS (+)   Anesthesia Other Findings   Reproductive/Obstetrics                             Anesthesia Physical Anesthesia Plan  ASA: 1  Anesthesia Plan: General   Post-op Pain Management:    Induction:   PONV Risk Score and Plan: 1  Airway Management Planned: LMA  Additional Equipment:   Intra-op Plan:   Post-operative Plan:   Informed Consent: I have reviewed the patients History and Physical, chart, labs and discussed the procedure including the risks, benefits and alternatives for the proposed anesthesia with the patient or authorized representative who has indicated his/her understanding and acceptance.     Dental advisory given  Plan Discussed with: CRNA  Anesthesia Plan Comments:        Anesthesia Quick Evaluation

## 2024-02-02 NOTE — Op Note (Signed)
 Preoperative diagnosis: Postmenopausal bleeding                                         Endometrial polyp                                        Low grade dysplasia with inadequate colposcopy   Postoperative diagnosis: SAA  Procedure: Diagnostic hysteroscopy with uterine curettage                    Laser conization of the cervix  Surgeon: Wendelyn Halter   Anesthesia: Laryngeal mask airway   Findings: office polyp removal revelaed endometrial polyp Also cervical biopsy revealed LSIL Colposcopy is inadequate which requires laser conization for diagnostic and therapeutic purposes    Intra-op findings were unremarkable  Description of operation: Patient was taken to the operating room and placed in the supine position where she underwent laryngeal mask airway anesthesia. She was placed in the dorsal lithotomy position.  She was prepped and draped in the usual sterile fashion.  A Graves speculum was placed.  The cervix was grasped with a single-tooth tenaculum.  The cervix was dilated serially using Hegar dilators to allow passage of the hysteroscope.  The hysteroscope was then placed into the endometrial cavity without difficulty and the above noted findings were seen.   The endometrium was thoroughly curetted with good uterine crie in all areas  Hysteroscopic reevaluation confirmed the removal of all endometrial pathology  There was good hemostasis with only suspected minor endometrial using  The patient tolerated the procedure well  She experienced minimal blood loss    Laser conization:   The microscope was used and 3% acetic acid was placed on the cervix.  The holmium laser was then employed at a power of 2.5 and rates between 8 and 15.  I achieved a couple millimeter margin around lesions both at 12:00 and 6:00 the laser was used to perform a conization.  The specimen was removed and sent to pathology for evaluation.  As is always the case with laser I did achieve at an  appropriate margin around the disease with shrinkage of the tissue during the procedure it may appear to be a positive lateral margin.  However the surgical margin is indeed clear.  I then used the laser to ablate the conization bed to a depth of 5-7 mm laterally coning down to 9 mm centrally and began getting good surgical margin.  Additional hemostasis was achieved using Monsel solution.  In the conization bed was completely hemostatic.  Blood loss for the procedure was none.    She was taken to the recovery room in good stable condition  All counts were correct x3  She received 2 g of Ancef and 30 mg of Toradol preoperatively  She will be followed up in the office next week for postoperative visit and to review the pathology which I expect to be benign  Wendelyn Halter, MD 02/02/2024 12:45 PM

## 2024-02-02 NOTE — Transfer of Care (Signed)
 Immediate Anesthesia Transfer of Care Note  Patient: Jodi [name]  Procedure(s) Performed: CERVIX CONIZATION (Cervix) DILATATION AND CURETTAGE /HYSTEROSCOPY (Uterus)  Patient Location: PACU  Anesthesia Type:General  Level of Consciousness: drowsy  Airway & Oxygen Therapy: Patient Spontanous Breathing and Patient connected to face mask oxygen  Post-op Assessment: Report given to RN and Post -op Vital signs reviewed and stable  Post vital signs: Reviewed and stable  Last Vitals:  Vitals Value Taken Time  BP 96/52 02/02/24 1234  Temp 97.5   Pulse 55 02/02/24 1236  Resp 14 02/02/24 1236  SpO2 100 % 02/02/24 1236  Vitals shown include unfiled device data.  Last Pain:  Vitals:   02/02/24 0948  PainSc: 0-No pain         Complications: No notable events documented.

## 2024-02-02 NOTE — H&P (Signed)
 Preoperative History and Physical  Jodi [name] is a 63 y.o. [phone] with Patient's last menstrual period was 04/07/2016 (lmp unknown). admitted for a hysteroscopy D&C for endometrial polyp and laser conization of the cervix for dysplastic changes with inadequate colposcopy.    PMH:    Past Medical History:  Diagnosis Date   Cervical polyp    Medical history non-contributory     PSH:     Past Surgical History:  Procedure Laterality Date   COLONOSCOPY WITH PROPOFOL N/A 11/07/2021   Procedure: COLONOSCOPY WITH PROPOFOL;  Surgeon: Lanelle Bal, DO;  Location: [facility];  Service: Endoscopy;  Laterality: N/A;  8:15 / ASA II   NO PAST SURGERIES      POb/GynH:      OB History     Gravida  2   Para  2   Term  2   Preterm      AB      Living  2      SAB      IAB      Ectopic      Multiple      Live Births              SH:   Social History   Tobacco Use   Smoking status: Never   Smokeless tobacco: Never  Vaping Use   Vaping status: Never Used  Substance Use Topics   Alcohol use: Not Currently    Comment: rarely   Drug use: Never    FH:    Family History  Problem Relation Age of Onset   Colon cancer Father    Colon cancer Other      Allergies:  Allergies  Allergen Reactions   Sulfamethoxazole Hives    Medications:       Current Facility-Administered Medications:    ceFAZolin (ANCEF) 2-4 GM/100ML-% IVPB, , , ,    ceFAZolin (ANCEF) IVPB 2g/100 mL premix, 2 g, Intravenous, On Call to OR, Lazaro Arms, MD   ketorolac (TORADOL) 30 MG/ML injection, , , ,    water for irrigation, sterile for irrigation SOLN, , , PRN, Lazaro Arms, MD, 1,000 mL at 02/02/24 1109  Facility-Administered Medications Ordered in Other Encounters:    scopolamine (TRANSDERM-SCOP) 1 MG/3DAYS, , Transdermal, Anesthesia Intra-op, Everardo All, Amy C, RN, 1 mg at 02/02/24 1102  Review of Systems:   Review of Systems  Constitutional: Negative for fever, chills,  weight loss, malaise/fatigue and diaphoresis.  HENT: Negative for hearing loss, ear pain, nosebleeds, congestion, sore throat, neck pain, tinnitus and ear discharge.   Eyes: Negative for blurred vision, double vision, photophobia, pain, discharge and redness.  Respiratory: Negative for cough, hemoptysis, sputum production, shortness of breath, wheezing and stridor.   Cardiovascular: Negative for chest pain, palpitations, orthopnea, claudication, leg swelling and PND.  Gastrointestinal: Positive for abdominal pain. Negative for heartburn, nausea, vomiting, diarrhea, constipation, blood in stool and melena.  Genitourinary: Negative for dysuria, urgency, frequency, hematuria and flank pain.  Musculoskeletal: Negative for myalgias, back pain, joint pain and falls.  Skin: Negative for itching and rash.  Neurological: Negative for dizziness, tingling, tremors, sensory change, speech change, focal weakness, seizures, loss of consciousness, weakness and headaches.  Endo/Heme/Allergies: Negative for environmental allergies and polydipsia. Does not bruise/bleed easily.  Psychiatric/Behavioral: Negative for depression, suicidal ideas, hallucinations, memory loss and substance abuse. The patient is not nervous/anxious and does not have insomnia.      PHYSICAL EXAM:  Blood pressure (!) 172/93, pulse 70,  temperature 98.7 F (37.1 C), resp. rate (!) 22, last menstrual period 04/07/2016, SpO2 100%.    Vitals reviewed. Constitutional: She is oriented to person, place, and time. She appears well-developed and well-nourished.  HENT:  Head: Normocephalic and atraumatic.  Right Ear: External ear normal.  Left Ear: External ear normal.  Nose: Nose normal.  Mouth/Throat: Oropharynx is clear and moist.  Eyes: Conjunctivae and EOM are normal. Pupils are equal, round, and reactive to light. Right eye exhibits no discharge. Left eye exhibits no discharge. No scleral icterus.  Neck: Normal range of motion. Neck  supple. No tracheal deviation present. No thyromegaly present.  Cardiovascular: Normal rate, regular rhythm, normal heart sounds and intact distal pulses.  Exam reveals no gallop and no friction rub.   No murmur heard. Respiratory: Effort normal and breath sounds normal. No respiratory distress. She has no wheezes. She has no rales. She exhibits no tenderness.  GI: Soft. Bowel sounds are normal. She exhibits no distension and no mass. There is tenderness. There is no rebound and no guarding.  Genitourinary:       Vulva is normal without lesions Vagina is pink moist without discharge Cervix normal in appearance and pap is normal Uterus is normal size, contour, position, consistency, mobility, non-tender Adnexa is negative with normal sized ovaries by sonogram  Musculoskeletal: Normal range of motion. She exhibits no edema and no tenderness.  Neurological: She is alert and oriented to person, place, and time. She has normal reflexes. She displays normal reflexes. No cranial nerve deficit. She exhibits normal muscle tone. Coordination normal.  Skin: Skin is warm and dry. No rash noted. No erythema. No pallor.  Psychiatric: She has a normal mood and affect. Her behavior is normal. Judgment and thought content normal.    Labs: Results for orders placed or performed during the hospital encounter of 01/31/24 (from the past 2 weeks)  CBC   Collection Time: 01/31/24 10:12 AM  Result Value Ref Range   WBC 5.8 4.0 - 10.5 K/uL   RBC 4.76 3.87 - 5.11 MIL/uL   Hemoglobin 13.8 12.0 - 15.0 g/dL   HCT 19.1 47.8 - 29.5 %   MCV 91.8 80.0 - 100.0 fL   MCH 29.0 26.0 - 34.0 pg   MCHC 31.6 30.0 - 36.0 g/dL   RDW 62.1 30.8 - 65.7 %   Platelets 276 150 - 400 K/uL   nRBC 0.0 0.0 - 0.2 %  Comprehensive metabolic panel   Collection Time: 01/31/24 10:12 AM  Result Value Ref Range   Sodium 138 135 - 145 mmol/L   Potassium 3.5 3.5 - 5.1 mmol/L   Chloride 104 98 - 111 mmol/L   CO2 24 22 - 32 mmol/L   Glucose,  Bld 104 (H) 70 - 99 mg/dL   BUN 23 8 - 23 mg/dL   Creatinine, Ser 8.46 0.44 - 1.00 mg/dL   Calcium 8.9 8.9 - 96.2 mg/dL   Total Protein 6.9 6.5 - 8.1 g/dL   Albumin 3.9 3.5 - 5.0 g/dL   AST 26 15 - 41 U/L   ALT 33 0 - 44 U/L   Alkaline Phosphatase 84 38 - 126 U/L   Total Bilirubin 0.7 0.0 - 1.2 mg/dL   GFR, Estimated >95 >28 mL/min   Anion gap 10 5 - 15  Rapid HIV screen (HIV 1/2 Ab+Ag)   Collection Time: 01/31/24 10:12 AM  Result Value Ref Range   HIV-1 P24 Antigen - HIV24 NON REACTIVE NON REACTIVE   HIV  1/2 Antibodies NON REACTIVE NON REACTIVE   Interpretation (HIV Ag Ab)      A non reactive test result means that HIV 1 or HIV 2 antibodies and HIV 1 p24 antigen were not detected in the specimen.  Urinalysis, Routine w reflex microscopic -Urine, Clean Catch   Collection Time: 01/31/24 10:12 AM  Result Value Ref Range   Color, Urine YELLOW YELLOW   APPearance CLEAR CLEAR   Specific Gravity, Urine 1.010 1.005 - 1.030   pH 5.0 5.0 - 8.0   Glucose, UA NEGATIVE NEGATIVE mg/dL   Hgb urine dipstick NEGATIVE NEGATIVE   Bilirubin Urine NEGATIVE NEGATIVE   Ketones, ur NEGATIVE NEGATIVE mg/dL   Protein, ur NEGATIVE NEGATIVE mg/dL   Nitrite NEGATIVE NEGATIVE   Leukocytes,Ua TRACE (A) NEGATIVE   RBC / HPF 0-5 0 - 5 RBC/hpf   WBC, UA 0-5 0 - 5 WBC/hpf   Bacteria, UA RARE (A) NONE SEEN   Squamous Epithelial / HPF 0-5 0 - 5 /HPF    EKG: No orders found for this or any previous visit.  Imaging Studies: No results found.    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

## 2024-02-02 NOTE — Anesthesia Procedure Notes (Signed)
 Procedure Name: LMA Insertion Date/Time: 02/02/2024 11:35 AM  Performed by: Sherwin Donate, CRNAPre-anesthesia Checklist: Patient identified, Emergency Drugs available, Suction available and Patient being monitored Patient Re-evaluated:Patient Re-evaluated prior to induction Oxygen Delivery Method: Circle system utilized Preoxygenation: Pre-oxygenation with 100% oxygen Induction Type: IV induction Ventilation: Mask ventilation without difficulty LMA: LMA inserted LMA Size: 4.0 Tube type: Oral Number of attempts: 1 Placement Confirmation: positive ETCO2 and breath sounds checked- equal and bilateral Tube secured with: Tape Dental Injury: Teeth and Oropharynx as per pre-operative assessment

## 2024-02-03 ENCOUNTER — Encounter: Payer: Self-pay | Admitting: Obstetrics & Gynecology

## 2024-02-03 ENCOUNTER — Encounter (HOSPITAL_COMMUNITY): Payer: Self-pay | Admitting: Obstetrics & Gynecology

## 2024-02-03 LAB — SURGICAL PATHOLOGY

## 2024-02-14 ENCOUNTER — Ambulatory Visit: Payer: 59 | Admitting: Obstetrics & Gynecology

## 2024-02-14 ENCOUNTER — Encounter: Payer: Self-pay | Admitting: Obstetrics & Gynecology

## 2024-02-14 VITALS — BP 195/81 | HR 83 | Ht 67.0 in | Wt 149.0 lb

## 2024-02-14 DIAGNOSIS — Z9889 Other specified postprocedural states: Secondary | ICD-10-CM

## 2024-02-14 NOTE — Progress Notes (Signed)
  HPI: Patient returns for routine postoperative follow-up having undergone laser conization of the cervix + hysteroscopy uterine curettage on 02/02/24.  The patient's immediate postoperative recovery has been unremarkable. Since hospital discharge the patient reports no problems.   Current Outpatient Medications: CRANBERRY PO, Take 1 tablet by mouth daily as needed (Urinary health). (Patient not taking: Reported on 02/14/2024), Disp: , Rfl:  HYDROcodone -acetaminophen  (NORCO/VICODIN) 5-325 MG tablet, Take 1 tablet by mouth every 6 (six) hours as needed. (Patient not taking: Reported on 02/14/2024), Disp: 10 tablet, Rfl: 0 ketorolac  (TORADOL ) 10 MG tablet, Take 1 tablet (10 mg total) by mouth every 8 (eight) hours as needed. (Patient not taking: Reported on 02/14/2024), Disp: 15 tablet, Rfl: 0 MAGNESIUM PO, Take 2 tablets by mouth 2 (two) times daily. (Patient not taking: Reported on 02/14/2024), Disp: , Rfl:  ondansetron  (ZOFRAN -ODT) 8 MG disintegrating tablet, Take 1 tablet (8 mg total) by mouth every 8 (eight) hours as needed for nausea or vomiting. (Patient not taking: Reported on 02/14/2024), Disp: 8 tablet, Rfl: 0  No current facility-administered medications for this visit.    Blood pressure (!) 165/85, pulse 87, height 5\' 7"  (1.702 m), weight 149 lb (67.6 kg), last menstrual period 04/07/2016.  Physical Exam: Normal cervical bed post laser  Diagnostic Tests:   Pathology: LSIL negative margin  Impression + Management plan: (Z61.096) Post-operative state: S/P hysteroscopy with endometrial curettage + laser conization of the cervix for inadequate colposcopy/LSIL  (primary encounter diagnosis)      Medications Prescribed this encounter: No orders of the defined types were placed in this encounter.     Follow up: No follow-ups on file.    Wendelyn Halter, MD Attending Physician for the [facility] and [facility] 02/14/2024 4:44 PM

## 2024-09-13 ENCOUNTER — Other Ambulatory Visit (HOSPITAL_COMMUNITY): Payer: Self-pay | Admitting: Internal Medicine

## 2024-09-13 DIAGNOSIS — Z1231 Encounter for screening mammogram for malignant neoplasm of breast: Secondary | ICD-10-CM

## 2024-09-25 ENCOUNTER — Ambulatory Visit (HOSPITAL_COMMUNITY)
Admission: RE | Admit: 2024-09-25 | Discharge: 2024-09-25 | Disposition: A | Source: Ambulatory Visit | Attending: Internal Medicine | Admitting: Internal Medicine

## 2024-09-25 ENCOUNTER — Ambulatory Visit (HOSPITAL_COMMUNITY)

## 2024-09-25 DIAGNOSIS — Z1231 Encounter for screening mammogram for malignant neoplasm of breast: Secondary | ICD-10-CM
